# Patient Record
Sex: Female | Born: 1985 | Race: Black or African American | Hispanic: No | Marital: Single | Smoking: Current every day smoker
Health system: Southern US, Community
[De-identification: ages and names within clinical notes are randomized; demographics above are authoritative.]

## PROBLEM LIST (undated history)

## (undated) DIAGNOSIS — R569 Unspecified convulsions: Secondary | ICD-10-CM

## (undated) DIAGNOSIS — M549 Dorsalgia, unspecified: Secondary | ICD-10-CM

---

## 2006-11-06 ENCOUNTER — Emergency Department (HOSPITAL_COMMUNITY): Admission: EM | Admit: 2006-11-06 | Discharge: 2006-11-06 | Payer: Self-pay | Admitting: Emergency Medicine

## 2010-03-31 ENCOUNTER — Emergency Department (HOSPITAL_BASED_OUTPATIENT_CLINIC_OR_DEPARTMENT_OTHER)
Admission: EM | Admit: 2010-03-31 | Discharge: 2010-03-31 | Payer: Self-pay | Source: Home / Self Care | Admitting: Emergency Medicine

## 2010-04-04 LAB — URINALYSIS, ROUTINE W REFLEX MICROSCOPIC
Bilirubin Urine: NEGATIVE
Ketones, ur: NEGATIVE mg/dL
Nitrite: POSITIVE — AB
Protein, ur: NEGATIVE mg/dL
Specific Gravity, Urine: 1.018 (ref 1.005–1.030)
Urine Glucose, Fasting: NEGATIVE mg/dL
Urobilinogen, UA: 0.2 mg/dL (ref 0.0–1.0)
pH: 7.5 (ref 5.0–8.0)

## 2010-04-04 LAB — URINE MICROSCOPIC-ADD ON

## 2010-04-04 LAB — PREGNANCY, URINE: Preg Test, Ur: NEGATIVE

## 2010-12-26 ENCOUNTER — Encounter: Payer: Self-pay | Admitting: *Deleted

## 2010-12-26 ENCOUNTER — Emergency Department (HOSPITAL_BASED_OUTPATIENT_CLINIC_OR_DEPARTMENT_OTHER)
Admission: EM | Admit: 2010-12-26 | Discharge: 2010-12-26 | Disposition: A | Discharge status: Home / Self Care | Payer: Self-pay | Attending: Emergency Medicine | Admitting: Emergency Medicine

## 2010-12-26 DIAGNOSIS — S39012A Strain of muscle, fascia and tendon of lower back, initial encounter: Secondary | ICD-10-CM

## 2010-12-26 DIAGNOSIS — F17200 Nicotine dependence, unspecified, uncomplicated: Secondary | ICD-10-CM | POA: Insufficient documentation

## 2010-12-26 DIAGNOSIS — X58XXXA Exposure to other specified factors, initial encounter: Secondary | ICD-10-CM | POA: Insufficient documentation

## 2010-12-26 DIAGNOSIS — S335XXA Sprain of ligaments of lumbar spine, initial encounter: Secondary | ICD-10-CM | POA: Insufficient documentation

## 2010-12-26 HISTORY — DX: Unspecified convulsions: R56.9

## 2010-12-26 HISTORY — DX: Dorsalgia, unspecified: M54.9

## 2010-12-26 MED ORDER — METHOCARBAMOL 500 MG PO TABS: 500.0000 mg | ORAL_TABLET | Freq: Two times a day (BID) | ORAL | Status: AC

## 2010-12-26 MED ORDER — OXYCODONE-ACETAMINOPHEN 5-325 MG PO TABS
2.0000 | ORAL_TABLET | Freq: Once | ORAL | Status: AC
  Administered 2010-12-26: 2 via ORAL
  Filled 2010-12-26: qty 2

## 2010-12-26 MED ORDER — HYDROCODONE-ACETAMINOPHEN 5-325 MG PO TABS: 2.0000 | ORAL_TABLET | ORAL | Status: AC | PRN
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
## 2010-12-26 NOTE — ED Provider Notes (Signed)
Medical screening examination/treatment/procedure(s) were performed by non-physician practitioner and as supervising physician I was immediately available for consultation/collaboration.   Charles B. Bernette Mayers, MD 12/26/10 [phone]
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
## 2012-10-31 ENCOUNTER — Encounter (HOSPITAL_BASED_OUTPATIENT_CLINIC_OR_DEPARTMENT_OTHER): Payer: Self-pay | Admitting: *Deleted

## 2012-10-31 ENCOUNTER — Emergency Department (HOSPITAL_BASED_OUTPATIENT_CLINIC_OR_DEPARTMENT_OTHER): Payer: BC Managed Care – PPO

## 2012-10-31 ENCOUNTER — Emergency Department (HOSPITAL_BASED_OUTPATIENT_CLINIC_OR_DEPARTMENT_OTHER)
Admission: EM | Admit: 2012-10-31 | Discharge: 2012-10-31 | Disposition: A | Discharge status: Home / Self Care | Payer: BC Managed Care – PPO | Attending: Emergency Medicine | Admitting: Emergency Medicine

## 2012-10-31 DIAGNOSIS — Z79899 Other long term (current) drug therapy: Secondary | ICD-10-CM | POA: Insufficient documentation

## 2012-10-31 DIAGNOSIS — S0990XA Unspecified injury of head, initial encounter: Secondary | ICD-10-CM | POA: Insufficient documentation

## 2012-10-31 DIAGNOSIS — S39012A Strain of muscle, fascia and tendon of lower back, initial encounter: Secondary | ICD-10-CM

## 2012-10-31 DIAGNOSIS — IMO0002 Reserved for concepts with insufficient information to code with codable children: Secondary | ICD-10-CM | POA: Insufficient documentation

## 2012-10-31 DIAGNOSIS — S1091XA Abrasion of unspecified part of neck, initial encounter: Secondary | ICD-10-CM

## 2012-10-31 DIAGNOSIS — Y9389 Activity, other specified: Secondary | ICD-10-CM | POA: Insufficient documentation

## 2012-10-31 DIAGNOSIS — Y92410 Unspecified street and highway as the place of occurrence of the external cause: Secondary | ICD-10-CM | POA: Insufficient documentation

## 2012-10-31 DIAGNOSIS — S46909A Unspecified injury of unspecified muscle, fascia and tendon at shoulder and upper arm level, unspecified arm, initial encounter: Secondary | ICD-10-CM | POA: Insufficient documentation

## 2012-10-31 DIAGNOSIS — F17200 Nicotine dependence, unspecified, uncomplicated: Secondary | ICD-10-CM | POA: Insufficient documentation

## 2012-10-31 DIAGNOSIS — S335XXA Sprain of ligaments of lumbar spine, initial encounter: Secondary | ICD-10-CM | POA: Insufficient documentation

## 2012-10-31 DIAGNOSIS — S4980XA Other specified injuries of shoulder and upper arm, unspecified arm, initial encounter: Secondary | ICD-10-CM | POA: Insufficient documentation

## 2012-10-31 MED ORDER — OXYCODONE-ACETAMINOPHEN 5-325 MG PO TABS: 1.0000 | ORAL_TABLET | Freq: Four times a day (QID) | ORAL | Status: DC | PRN

## 2012-10-31 MED ORDER — OXYCODONE-ACETAMINOPHEN 5-325 MG PO TABS
ORAL_TABLET | ORAL | Status: AC
  Filled 2012-10-31: qty 2

## 2012-10-31 MED ORDER — CYCLOBENZAPRINE HCL 10 MG PO TABS: 10.0000 mg | ORAL_TABLET | Freq: Three times a day (TID) | ORAL | Status: DC | PRN

## 2012-10-31 MED ORDER — OXYCODONE-ACETAMINOPHEN 5-325 MG PO TABS
2.0000 | ORAL_TABLET | Freq: Once | ORAL | Status: AC
  Administered 2012-10-31: 2 via ORAL
  Filled 2012-10-31: qty 2
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
## 2015-02-17 ENCOUNTER — Encounter (HOSPITAL_BASED_OUTPATIENT_CLINIC_OR_DEPARTMENT_OTHER): Payer: Self-pay | Admitting: Emergency Medicine

## 2015-02-17 ENCOUNTER — Emergency Department (HOSPITAL_BASED_OUTPATIENT_CLINIC_OR_DEPARTMENT_OTHER)
Admission: EM | Admit: 2015-02-17 | Discharge: 2015-02-17 | Disposition: A | Discharge status: Home / Self Care | Payer: Self-pay | Attending: Emergency Medicine | Admitting: Emergency Medicine

## 2015-02-17 DIAGNOSIS — Y9289 Other specified places as the place of occurrence of the external cause: Secondary | ICD-10-CM | POA: Insufficient documentation

## 2015-02-17 DIAGNOSIS — Z79899 Other long term (current) drug therapy: Secondary | ICD-10-CM | POA: Insufficient documentation

## 2015-02-17 DIAGNOSIS — Y998 Other external cause status: Secondary | ICD-10-CM | POA: Insufficient documentation

## 2015-02-17 DIAGNOSIS — G8929 Other chronic pain: Secondary | ICD-10-CM | POA: Insufficient documentation

## 2015-02-17 DIAGNOSIS — S3992XA Unspecified injury of lower back, initial encounter: Secondary | ICD-10-CM | POA: Insufficient documentation

## 2015-02-17 DIAGNOSIS — Y9301 Activity, walking, marching and hiking: Secondary | ICD-10-CM | POA: Insufficient documentation

## 2015-02-17 DIAGNOSIS — S8991XA Unspecified injury of right lower leg, initial encounter: Secondary | ICD-10-CM | POA: Insufficient documentation

## 2015-02-17 DIAGNOSIS — W010XXA Fall on same level from slipping, tripping and stumbling without subsequent striking against object, initial encounter: Secondary | ICD-10-CM | POA: Insufficient documentation

## 2015-02-17 DIAGNOSIS — M79661 Pain in right lower leg: Secondary | ICD-10-CM

## 2015-02-17 NOTE — Discharge Instructions (Signed)
Musculoskeletal Pain Musculoskeletal pain is muscle and boney aches and pains. These pains can occur in any part of the body. Your caregiver may treat you without knowing the cause of the pain. They may treat you if blood or urine tests, X-rays, and other tests were normal.  CAUSES There is often not a definite cause or reason for these pains. These pains may be caused by a type of germ (virus). The discomfort may also come from overuse. Overuse includes working out too hard when your body is not fit. Boney aches also come from weather changes. Bone is sensitive to atmospheric pressure changes. HOME CARE INSTRUCTIONS   Ask when your test results will be ready. Make sure you get your test results.  Only take over-the-counter or prescription medicines for pain, discomfort, or fever as directed by your caregiver. If you were given medications for your condition, do not drive, operate machinery or power tools, or sign legal documents for 24 hours. Do not drink alcohol. Do not take sleeping pills or other medications that may interfere with treatment.  Continue all activities unless the activities cause more pain. When the pain lessens, slowly resume normal activities. Gradually increase the intensity and duration of the activities or exercise.  During periods of severe pain, bed rest may be helpful. Lay or sit in any position that is comfortable.  Putting ice on the injured area.  Put ice in a bag.  Place a towel between your skin and the bag.  Leave the ice on for 15 to 20 minutes, 3 to 4 times a day.  Follow up with your caregiver for continued problems and no reason can be found for the pain. If the pain becomes worse or does not go away, it may be necessary to repeat tests or do additional testing. Your caregiver may need to look further for a possible cause. SEEK IMMEDIATE MEDICAL CARE IF:  You have pain that is getting worse and is not relieved by medications.  You develop chest pain  that is associated with shortness or breath, sweating, feeling sick to your stomach (nauseous), or throw up (vomit).  Your pain becomes localized to the abdomen.  You develop any new symptoms that seem different or that concern you. MAKE SURE YOU:   Understand these instructions.  Will watch your condition.  Will get help right away if you are not doing well or get worse.   This information is not intended to replace advice given to you by your health care provider. Make sure you discuss any questions you have with your health care provider.   Document Released: 03/06/2005 Document Revised: 05/29/2011 Document Reviewed: 11/08/2012 Elsevier Interactive Patient Education [employer]2016 Elsevier Inc.  You may return to work immediately with no restrictions.

## 2015-02-17 NOTE — ED Notes (Signed)
Pt fell on November 21, injuring her right leg.  Pt unable to drive or work, until today.  Pt here to get it checked out to be cleared to return to work.

## 2015-02-17 NOTE — ED Provider Notes (Signed)
CSN: [id number]     Arrival date & time 02/17/15  [id number] History   First MD Initiated Contact with Patient 02/17/15 [phone]     Chief Complaint  Patient presents with  . Leg Pain     (Consider location/radiation/quality/duration/timing/severity/associated sxs/prior Treatment) Patient is a 29 y.o. female presenting with leg pain. The history is provided by the patient.  Leg Pain Location:  Leg Time since incident:  9 days Injury: yes   Mechanism of injury: fall   Fall:    Fall occurred:  Tripped and walking   Entrapped after fall: no   Leg location:  R lower leg Pain details:    Quality:  Aching   Radiates to:  Does not radiate   Severity:  Moderate   Onset quality:  Gradual   Timing:  Constant   Progression:  Unchanged Chronicity:  New Prior injury to area:  No Relieved by:  Nothing Worsened by:  Nothing tried Ineffective treatments:  None tried Associated symptoms: back pain (chronic from "bulging discs")   Associated symptoms: no fever, no muscle weakness, no numbness and no swelling     Past Medical History  Diagnosis Date  . Back pain   . Seizure (HCC)    No past surgical history on file. No family history on file. Social History  Substance Use Topics  . Smoking status: Current Every Day Smoker -- 1.00 packs/day  . Smokeless tobacco: None  . Alcohol Use: No   OB History    No data available     Review of Systems  Constitutional: Negative for fever.  Musculoskeletal: Positive for back pain (chronic from "bulging discs").  All other systems reviewed and are negative.     Allergies  Ketorolac tromethamine  Home Medications   Prior to Admission medications   Medication Sig Start Date End Date Taking? Authorizing Provider  cyclobenzaprine (FLEXERIL) 10 MG tablet Take 1 tablet (10 mg total) by mouth 3 (three) times daily as needed for muscle spasms. 10/31/12   Susy Frizzleharles Sheldon, MD  lamoTRIgine (LAMICTAL) 100 MG tablet Take 100 mg by mouth daily.       Historical Provider, MD  oxyCODONE-acetaminophen (PERCOCET/ROXICET) 5-325 MG per tablet Take 1-2 tablets by mouth every 6 (six) hours as needed for pain. 10/31/12   Susy Frizzleharles Sheldon, MD   BP 132/88 mmHg  Pulse 81  Temp(Src) 97.7 F (36.5 C) (Oral)  Ht 5\' 6"  (1.676 m)  Wt 205 lb (92.987 kg)  BMI 33.10 kg/m2  SpO2 100% Physical Exam  Constitutional: She is oriented to person, place, and time. She appears well-developed and well-nourished. No distress.  HENT:  Head: Normocephalic.  Eyes: Conjunctivae are normal.  Neck: Neck supple. No tracheal deviation present.  Cardiovascular: Normal rate and regular rhythm.   Pulmonary/Chest: Effort normal. No respiratory distress.  Abdominal: Soft. She exhibits no distension.  Musculoskeletal:       Right ankle: She exhibits normal range of motion, no swelling, no ecchymosis and no deformity. No tenderness.       Right lower leg: She exhibits no tenderness, no swelling, no edema and no deformity.       Right foot: There is normal range of motion, no tenderness, no bony tenderness and no swelling.  Neurological: She is alert and oriented to person, place, and time.  Skin: Skin is warm and dry.  Psychiatric: She has a normal mood and affect.    ED Course  Procedures (including critical care time) Labs Review Labs Reviewed - No  data to display  Imaging Review No results found. I have personally reviewed and evaluated these images and lab results as part of my medical decision-making.   EKG Interpretation None      MDM   Final diagnoses:  Pain of right lower leg    29 year old female with ongoing mild pain over her right shin after tripping 9 days ago. Employer would not allow her to the back to work until cleared by physician. She has no evidence of acute traumatic injury or any contraindication to normal duty labor. Return to work on unrestricted basis. Pt given instructions for supportive care including NSAIDs, rest, ice, compression,  and elevation to help alleviate symptoms.     Lyndal Pulley, MD 02/17/15 [phone]

## 2016-02-16 ENCOUNTER — Encounter (HOSPITAL_BASED_OUTPATIENT_CLINIC_OR_DEPARTMENT_OTHER): Payer: Self-pay

## 2016-02-16 ENCOUNTER — Emergency Department (HOSPITAL_BASED_OUTPATIENT_CLINIC_OR_DEPARTMENT_OTHER)
Admission: EM | Admit: 2016-02-16 | Discharge: 2016-02-17 | Disposition: A | Discharge status: Home / Self Care | Payer: Self-pay | Attending: Emergency Medicine | Admitting: Emergency Medicine

## 2016-02-16 DIAGNOSIS — F17210 Nicotine dependence, cigarettes, uncomplicated: Secondary | ICD-10-CM | POA: Insufficient documentation

## 2016-02-16 DIAGNOSIS — L02412 Cutaneous abscess of left axilla: Secondary | ICD-10-CM | POA: Insufficient documentation

## 2016-02-16 MED ORDER — LIDOCAINE-EPINEPHRINE (PF) 2 %-1:200000 IJ SOLN: 10.0000 mL | Freq: Once | INTRAMUSCULAR | Status: DC

## 2016-02-16 NOTE — ED Triage Notes (Signed)
C/o "boil" to left arm x 2 weeks-NAD-steady gait

## 2016-02-16 NOTE — ED Provider Notes (Signed)
[facility] Provider Note   CSN: [id number] Arrival date & time: 02/16/16  2209  By signing my name below, I, Rosario AdieWilliam Andrew Hiatt, attest that this documentation has been prepared under the direction and in the presence of Jadarius Commons, PA-C.  Electronically Signed: Rosario AdieWilliam Andrew Hiatt, ED Scribe. 02/16/16. 11:56 PM.  History   Chief Complaint Chief Complaint  Patient presents with  . Abscess   The history is provided by the patient. No language interpreter was used.    HPI Comments: Yvette Mcintyre is a 30 y.o. female who presents to the Emergency Department complaining of a moderate, gradually worsening area of pain and swelling to the left axilla region onset approximately one week ago. She reports associated subjective fever and intermittent chills since the onset of his problem. No h/o similar symptoms. She has been applying aloe vera and warm compresses to the area with minimal relief of her symptoms. Pt states pain is exacerbated with palpation and direct pressure. Denies drainage from the area, nausea, vomiting, or any other associated symptoms.   Past Medical History:  Diagnosis Date  . Back pain   . Seizure (HCC)    There are no active problems to display for this patient.  History reviewed. No pertinent surgical history.  OB History    No data available     Home Medications    Prior to Admission medications   Not on File   Family History No family history on file.  Social History Social History  Substance Use Topics  . Smoking status: Current Every Day Smoker    Packs/day: 1.00    Types: Cigarettes  . Smokeless tobacco: Never Used  . Alcohol use Yes     Comment: occ   Allergies   Ketorolac tromethamine  Review of Systems Review of Systems  Constitutional: Positive for chills and fever (subjective).  Gastrointestinal: Negative for nausea and vomiting.  Musculoskeletal: Positive for myalgias.   Physical Exam Updated Vital  Signs BP (!) 105/51 (BP Location: Left Arm)   Pulse 85   Temp 98.3 F (36.8 C) (Oral)   Resp 18   Ht 5\' 6"  (1.676 m)   Wt 208 lb (94.3 kg)   LMP 02/12/2016   SpO2 95%   BMI 33.57 kg/m   Physical Exam  Constitutional: She appears well-developed and well-nourished. No distress.  HENT:  Head: Normocephalic and atraumatic.  Eyes: Conjunctivae are normal.  Neck: Normal range of motion.  Cardiovascular: Normal rate.   Pulmonary/Chest: Effort normal.  Abdominal: She exhibits no distension.  Musculoskeletal: Normal range of motion.  Neurological: She is alert.  Skin: No pallor.  4 x 4 centimeter abscess to left axilla that is erythematous, tender to the touch, fluctuant. Mild surrounding induration and erythema.  Psychiatric: She has a normal mood and affect. Her behavior is normal.  Nursing note and vitals reviewed.  ED Treatments / Results  DIAGNOSTIC STUDIES: Oxygen Saturation is 95% on RA, adequate by my interpretation.   COORDINATION OF CARE: 11:56 PM-Discussed next steps with pt. Pt verbalized understanding and is agreeable with the plan.   Labs (all labs ordered are listed, but only abnormal results are displayed) Labs Reviewed - No data to display  EKG  EKG Interpretation None      Radiology No results found.  Procedures Procedures   INCISION AND DRAINAGE Performed by: Jaynie CrumbleKIRICHENKO, Renn Dirocco A Consent: Verbal consent obtained. Risks and benefits: risks, benefits and alternatives were discussed Type: abscess  Body area: left axilla  Anesthesia: local infiltration  Incision was made with a scalpel.  Local anesthetic: lidocaine 2% w epinephrine  Anesthetic total: 8 ml  Complexity: complex Blunt dissection to break up loculations  Drainage: purulent  Drainage amount: copious  Packing material: 1/4 in iodoform gauze  Patient tolerance: Patient tolerated the procedure well with no immediate complications.    Medications Ordered in  ED Medications - No data to display  Initial Impression / Assessment and Plan / ED Course  I have reviewed the triage vital signs and the nursing notes.  Pertinent labs & imaging results that were available during my care of the patient were reviewed by me and considered in my medical decision making (see chart for details).  Clinical Course    Patient with left axillary abscess. She is afebrile here, nontoxic-appearing. Abscess incised and drained. There is mild induration around the abscess, will start on Bactrim. Home with Tylenol and Motrin for pain, I will prescribe her 10 tablets of Norco for severe pain, follow-up in 2-3 days as needed.   Vitals:   02/16/16 2214 02/17/16 0026  BP: (!) 105/51 119/76  Pulse: 85 78  Resp: 18 16  Temp: 98.3 F (36.8 C)   TempSrc: Oral   SpO2: 95% 100%  Weight: 94.3 kg   Height: 5\' 6"  (1.676 m)     Final Clinical Impressions(s) / ED Diagnoses   Final diagnoses:  Abscess of left axilla   New Prescriptions New Prescriptions   HYDROCODONE-ACETAMINOPHEN (NORCO) 5-325 MG TABLET    Take 1 tablet by mouth every 6 (six) hours as needed for moderate pain.   SULFAMETHOXAZOLE-TRIMETHOPRIM (BACTRIM DS,SEPTRA DS) 800-160 MG TABLET    Take 1 tablet by mouth 2 (two) times daily.    I personally performed the services described in this documentation, which was scribed in my presence. The recorded information has been reviewed and is accurate.      Jaynie Crumbleatyana Zeniya Lapidus, PA-C 02/17/16 0036    Paula LibraJohn Molpus, MD 02/17/16 [phone]

## 2016-02-17 MED ORDER — SULFAMETHOXAZOLE-TRIMETHOPRIM 800-160 MG PO TABS: 1.0000 | ORAL_TABLET | Freq: Two times a day (BID) | ORAL | 0 refills | Status: AC

## 2016-02-17 MED ORDER — LIDOCAINE-EPINEPHRINE (PF) 2 %-1:200000 IJ SOLN
INTRAMUSCULAR | Status: AC
  Administered 2016-02-17: 20 mL
  Filled 2016-02-17: qty 20

## 2016-02-17 MED ORDER — HYDROCODONE-ACETAMINOPHEN 5-325 MG PO TABS: 1.0000 | ORAL_TABLET | Freq: Four times a day (QID) | ORAL | 0 refills | Status: DC | PRN

## 2016-02-17 NOTE — ED Notes (Signed)
ED Provider at bedside. 

## 2016-02-17 NOTE — Discharge Instructions (Signed)
Warm compresses to the area. In 3 days pull out the packing. If not improving, return to ED. Bactrim as prescribed until all gone. Ibuprofen/tylenol for pain. Norco for severe pain only. Follow up with your doctor.

## 2016-06-11 ENCOUNTER — Encounter (HOSPITAL_BASED_OUTPATIENT_CLINIC_OR_DEPARTMENT_OTHER): Payer: Self-pay | Admitting: Emergency Medicine

## 2016-06-11 ENCOUNTER — Emergency Department (HOSPITAL_BASED_OUTPATIENT_CLINIC_OR_DEPARTMENT_OTHER)
Admission: EM | Admit: 2016-06-11 | Discharge: 2016-06-11 | Disposition: A | Discharge status: Home / Self Care | Payer: Self-pay | Attending: Emergency Medicine | Admitting: Emergency Medicine

## 2016-06-11 ENCOUNTER — Emergency Department (HOSPITAL_BASED_OUTPATIENT_CLINIC_OR_DEPARTMENT_OTHER): Payer: Self-pay

## 2016-06-11 DIAGNOSIS — Z3A01 Less than 8 weeks gestation of pregnancy: Secondary | ICD-10-CM | POA: Insufficient documentation

## 2016-06-11 DIAGNOSIS — R1030 Lower abdominal pain, unspecified: Secondary | ICD-10-CM | POA: Insufficient documentation

## 2016-06-11 DIAGNOSIS — F17210 Nicotine dependence, cigarettes, uncomplicated: Secondary | ICD-10-CM | POA: Insufficient documentation

## 2016-06-11 DIAGNOSIS — N3000 Acute cystitis without hematuria: Secondary | ICD-10-CM

## 2016-06-11 DIAGNOSIS — O99331 Smoking (tobacco) complicating pregnancy, first trimester: Secondary | ICD-10-CM | POA: Insufficient documentation

## 2016-06-11 DIAGNOSIS — Z3491 Encounter for supervision of normal pregnancy, unspecified, first trimester: Secondary | ICD-10-CM

## 2016-06-11 DIAGNOSIS — O2310 Infections of bladder in pregnancy, unspecified trimester: Secondary | ICD-10-CM | POA: Insufficient documentation

## 2016-06-11 LAB — COMPREHENSIVE METABOLIC PANEL
ALT: 27 U/L (ref 14–54)
AST: 22 U/L (ref 15–41)
Albumin: 3.7 g/dL (ref 3.5–5.0)
Alkaline Phosphatase: 48 U/L (ref 38–126)
Anion gap: 8 (ref 5–15)
BUN: 8 mg/dL (ref 6–20)
CO2: 21 mmol/L — ABNORMAL LOW (ref 22–32)
Calcium: 8.8 mg/dL — ABNORMAL LOW (ref 8.9–10.3)
Chloride: 105 mmol/L (ref 101–111)
Creatinine, Ser: 0.67 mg/dL (ref 0.44–1.00)
GFR calc Af Amer: >60 mL/min (ref >60)
GFR calc non Af Amer: >60 mL/min (ref >60)
Glucose, Bld: 85 mg/dL (ref 65–99)
Potassium: 3.7 mmol/L (ref 3.5–5.1)
Sodium: 134 mmol/L — ABNORMAL LOW (ref 135–145)
Total Bilirubin: 0.6 mg/dL (ref 0.3–1.2)
Total Protein: 6.9 g/dL (ref 6.5–8.1)

## 2016-06-11 LAB — URINALYSIS, ROUTINE W REFLEX MICROSCOPIC
Bilirubin Urine: NEGATIVE
Glucose, UA: NEGATIVE mg/dL
Hgb urine dipstick: NEGATIVE
Ketones, ur: NEGATIVE mg/dL
Nitrite: POSITIVE — AB
Protein, ur: NEGATIVE mg/dL
Specific Gravity, Urine: 1.017 (ref 1.005–1.030)
pH: 6.5 (ref 5.0–8.0)

## 2016-06-11 LAB — URINALYSIS, MICROSCOPIC (REFLEX): RBC / HPF: NONE SEEN RBC/hpf (ref 0–5)

## 2016-06-11 LAB — CBC WITH DIFFERENTIAL/PLATELET
Basophils Absolute: 0 10*3/uL (ref 0.0–0.1)
Basophils Relative: 0 %
Eosinophils Absolute: 0.1 10*3/uL (ref 0.0–0.7)
Eosinophils Relative: 1 %
HCT: 35.5 % — ABNORMAL LOW (ref 36.0–46.0)
Hemoglobin: 11.9 g/dL — ABNORMAL LOW (ref 12.0–15.0)
Lymphocytes Relative: 15 %
Lymphs Abs: 2 10*3/uL (ref 0.7–4.0)
MCH: 31.6 pg (ref 26.0–34.0)
MCHC: 33.5 g/dL (ref 30.0–36.0)
MCV: 94.2 fL (ref 78.0–100.0)
Monocytes Absolute: 0.7 10*3/uL (ref 0.1–1.0)
Monocytes Relative: 5 %
Neutro Abs: 10.8 10*3/uL — ABNORMAL HIGH (ref 1.7–7.7)
Neutrophils Relative %: 79 %
Platelets: 295 10*3/uL (ref 150–400)
RBC: 3.77 MIL/uL — ABNORMAL LOW (ref 3.87–5.11)
RDW: 13.9 % (ref 11.5–15.5)
WBC: 13.6 10*3/uL — ABNORMAL HIGH (ref 4.0–10.5)

## 2016-06-11 LAB — LIPASE, BLOOD: Lipase: 17 U/L (ref 11–51)

## 2016-06-11 LAB — HCG, QUANTITATIVE, PREGNANCY: hCG, Beta Chain, Quant, S: 41025 m[IU]/mL — ABNORMAL HIGH (ref <5)

## 2016-06-11 LAB — PREGNANCY, URINE: Preg Test, Ur: POSITIVE — AB

## 2016-06-11 MED ORDER — ACETAMINOPHEN 500 MG PO TABS
1000.0000 mg | ORAL_TABLET | Freq: Once | ORAL | Status: AC
  Administered 2016-06-11: 1000 mg via ORAL
  Filled 2016-06-11: qty 2

## 2016-06-11 MED ORDER — SODIUM CHLORIDE 0.9 % IV BOLUS (SEPSIS)
1000.0000 mL | Freq: Once | INTRAVENOUS | Status: AC
  Administered 2016-06-11: 1000 mL via INTRAVENOUS

## 2016-06-11 MED ORDER — CEPHALEXIN 500 MG PO CAPS: 500.0000 mg | ORAL_CAPSULE | Freq: Four times a day (QID) | ORAL | 0 refills | Status: DC

## 2016-06-11 MED ORDER — HYDROCODONE-ACETAMINOPHEN 5-325 MG PO TABS: 1.0000 | ORAL_TABLET | Freq: Four times a day (QID) | ORAL | 0 refills | Status: DC | PRN

## 2016-06-11 NOTE — ED Notes (Signed)
ED Provider at bedside. 

## 2016-06-11 NOTE — ED Triage Notes (Addendum)
Patient reports right flank pain which began yesterday.  Patient reports she had 1 episode of vomiting after drinking water.  Denies diarrhea. Reports LMP in January.  States possibility of pregnancy.

## 2016-06-11 NOTE — Discharge Instructions (Signed)
Keflex as prescribed.  Hydrocodone as prescribed as needed for pain.  Follow-up with an obstetrician in the next week, and return to the emergency department if your symptoms significantly worsen or change.

## 2016-06-11 NOTE — ED Provider Notes (Signed)
[facility] Provider Note   CSN: [id number] Arrival date & time: 06/11/16  1427   By signing my name below, I, Teofilo Pod, attest that this documentation has been prepared under the direction and in the presence of Geoffery Lyons, MD . Electronically Signed: Teofilo Pod, ED Scribe. 06/11/2016. 3:20 PM.   History   Chief Complaint Chief Complaint  Patient presents with  . Flank Pain    The history is provided by the patient. No language interpreter was used.  Flank Pain  This is a new problem. The current episode started yesterday. The problem occurs constantly. The problem has not changed since onset.Associated symptoms include chest pain. Nothing aggravates the symptoms. Nothing relieves the symptoms. She has tried nothing for the symptoms.   HPI Comments:  Yvette Mcintyre is a 31 y.o. female who presents to the Emergency Department complaining of constant right sided flank pain since yesterday. She states that the pain radiates up to her right chest near the right breast. Pt reports 1 episode of vomiting after drinking water. She notes having a mild dry cough for the past week. LNMP was 2 months ago, and states that she recently had her IUD removed. She states that she is otherwise healthy. No alleviating factors noted. Pt denies any fever, vaginal bleeding, back pain.   Past Medical History:  Diagnosis Date  . Back pain   . Seizure (HCC)     There are no active problems to display for this patient.   History reviewed. No pertinent surgical history.  OB History    No data available       Home Medications    Prior to Admission medications   Medication Sig Start Date End Date Taking? Authorizing Provider  HYDROcodone-acetaminophen (NORCO) 5-325 MG tablet Take 1 tablet by mouth every 6 (six) hours as needed for moderate pain. 02/17/16   Jaynie Crumble, PA-C    Family History History reviewed. No pertinent family history.  Social  History Social History  Substance Use Topics  . Smoking status: Current Every Day Smoker    Packs/day: 1.00    Types: Cigarettes  . Smokeless tobacco: Never Used  . Alcohol use Yes     Comment: occ     Allergies   Ketorolac tromethamine   Review of Systems Review of Systems  Constitutional: Negative for fever.  Respiratory: Positive for cough.   Cardiovascular: Positive for chest pain.  Gastrointestinal: Positive for nausea and vomiting.  Genitourinary: Positive for flank pain. Negative for vaginal bleeding.  Musculoskeletal: Negative for back pain.  All other systems reviewed and are negative.    Physical Exam Updated Vital Signs BP (!) 91/50 (BP Location: Right Arm)   Pulse 71   Temp 98.3 F (36.8 C) (Oral)   Resp 17   Ht 5\' 6"  (1.676 m)   Wt 200 lb (90.7 kg)   LMP 04/16/2016 (Approximate)   SpO2 100%   BMI 32.28 kg/m   Physical Exam  Constitutional: She is oriented to person, place, and time. She appears well-developed and well-nourished. No distress.  HENT:  Head: Normocephalic and atraumatic.  Mouth/Throat: Oropharynx is clear and moist. No oropharyngeal exudate.  Eyes: Conjunctivae and EOM are normal. Pupils are equal, round, and reactive to light.  Neck: Normal range of motion. Neck supple.  No meningismus.  Cardiovascular: Normal rate, regular rhythm, normal heart sounds and intact distal pulses.   No murmur heard. Pulmonary/Chest: Effort normal and breath sounds normal. No respiratory distress.  Abdominal: Soft. There is tenderness. There is no rebound and no guarding.  Mild tenderness to right flank.   Musculoskeletal: Normal range of motion. She exhibits no edema or tenderness.  Neurological: She is alert and oriented to person, place, and time. No cranial nerve deficit. She exhibits normal muscle tone. Coordination normal.  No ataxia on finger to nose bilaterally. No pronator drift. 5/5 strength throughout. CN 2-12 intact.Equal grip strength.  Sensation intact.   Skin: Skin is warm.  Psychiatric: She has a normal mood and affect. Her behavior is normal.  Nursing note and vitals reviewed.    ED Treatments / Results  DIAGNOSTIC STUDIES:  Oxygen Saturation is 100% on RA, normal by my interpretation.    COORDINATION OF CARE:  3:19 PM Will order US of abdomen. Discussed treatment plan with pt at bedside and pt agreed to plan.   Labs (all labs ordered are listed, but only abnormal results are displayed) Labs Reviewed  PREGNANCY, URINE - Abnormal; Notable for the following:       Result Value   Preg Test, Ur POSITIVE (*)    All other components within normal limits  URINALYSIS, ROUTINE W REFLEX MICROSCOPIC    EKG  EKG Interpretation None       Radiology No results found.  Procedures Procedures (including critical care time)  Medications Ordered in ED Medications - No data to display   Initial Impression / Assessment and Plan / ED Course  I have reviewed the triage vital signs and the nursing notes.  Pertinent labs & imaging results that were available during my care of the patient were reviewed by me and considered in my medical decision making (see chart for details).  Patient is a 31 year old female who presents with flank pain since yesterday. She describes an episode of vomiting this afternoon. Laboratory studies and urinalysis were obtained. These were essentially unremarkable with the exception of having a positive pregnancy test. She was then sent for ultrasound which revealed twin gestation with a small subchorionic hemorrhage, but no other concerning findings. She will be discharged, to follow-up with OB/GYN.  Final Clinical Impressions(s) / ED Diagnoses   Final diagnoses:  None    New Prescriptions New Prescriptions   No medications on file  I personally performed the services described in this documentation, which was scribed in my presence. The recorded information has been reviewed and is  accurate.        Geoffery Lyonsouglas Ozell Juhasz, MD 06/14/16 [phone]

## 2016-06-11 NOTE — ED Notes (Signed)
Patient transported to Ultrasound 

## 2017-03-15 ENCOUNTER — Other Ambulatory Visit: Payer: Self-pay

## 2017-03-15 ENCOUNTER — Inpatient Hospital Stay (HOSPITAL_BASED_OUTPATIENT_CLINIC_OR_DEPARTMENT_OTHER)
Admission: EM | Admit: 2017-03-15 | Discharge: 2017-03-18 | DRG: 872 | Disposition: A | Discharge status: Home / Self Care | Payer: Self-pay | Attending: Family Medicine | Admitting: Family Medicine

## 2017-03-15 ENCOUNTER — Encounter (HOSPITAL_BASED_OUTPATIENT_CLINIC_OR_DEPARTMENT_OTHER): Payer: Self-pay

## 2017-03-15 ENCOUNTER — Emergency Department (HOSPITAL_BASED_OUTPATIENT_CLINIC_OR_DEPARTMENT_OTHER): Payer: Self-pay

## 2017-03-15 DIAGNOSIS — A419 Sepsis, unspecified organism: Principal | ICD-10-CM | POA: Diagnosis present

## 2017-03-15 DIAGNOSIS — B9620 Unspecified Escherichia coli [E. coli] as the cause of diseases classified elsewhere: Secondary | ICD-10-CM | POA: Diagnosis present

## 2017-03-15 DIAGNOSIS — E876 Hypokalemia: Secondary | ICD-10-CM | POA: Diagnosis present

## 2017-03-15 DIAGNOSIS — N12 Tubulo-interstitial nephritis, not specified as acute or chronic: Secondary | ICD-10-CM | POA: Diagnosis present

## 2017-03-15 DIAGNOSIS — F17210 Nicotine dependence, cigarettes, uncomplicated: Secondary | ICD-10-CM | POA: Diagnosis present

## 2017-03-15 DIAGNOSIS — N390 Urinary tract infection, site not specified: Secondary | ICD-10-CM

## 2017-03-15 LAB — URINALYSIS, MICROSCOPIC (REFLEX)

## 2017-03-15 LAB — COMPREHENSIVE METABOLIC PANEL
ALBUMIN: 3.6 g/dL (ref 3.5–5.0)
ALT: 16 U/L (ref 14–54)
ANION GAP: 10 (ref 5–15)
AST: 13 U/L — ABNORMAL LOW (ref 15–41)
Alkaline Phosphatase: 47 U/L (ref 38–126)
BUN: 12 mg/dL (ref 6–20)
CALCIUM: 8.3 mg/dL — AB (ref 8.9–10.3)
CHLORIDE: 105 mmol/L (ref 101–111)
CO2: 20 mmol/L — AB (ref 22–32)
Creatinine, Ser: 0.55 mg/dL (ref 0.44–1.00)
GFR calc non Af Amer: >60 mL/min (ref >60)
GLUCOSE: 96 mg/dL (ref 65–99)
POTASSIUM: 3.2 mmol/L — AB (ref 3.5–5.1)
SODIUM: 135 mmol/L (ref 135–145)
Total Bilirubin: 0.5 mg/dL (ref 0.3–1.2)
Total Protein: 7.4 g/dL (ref 6.5–8.1)

## 2017-03-15 LAB — URINALYSIS, ROUTINE W REFLEX MICROSCOPIC
Bilirubin Urine: NEGATIVE
Glucose, UA: NEGATIVE mg/dL
Ketones, ur: 15 mg/dL — AB
Nitrite: POSITIVE — AB
Protein, ur: 100 mg/dL — AB
Specific Gravity, Urine: 1.025 (ref 1.005–1.030)
pH: 6 (ref 5.0–8.0)

## 2017-03-15 LAB — CBC
HEMATOCRIT: 33.8 % — AB (ref 36.0–46.0)
HEMOGLOBIN: 11.1 g/dL — AB (ref 12.0–15.0)
MCH: 31.5 pg (ref 26.0–34.0)
MCHC: 32.8 g/dL (ref 30.0–36.0)
MCV: 96 fL (ref 78.0–100.0)
Platelets: 294 10*3/uL (ref 150–400)
RBC: 3.52 MIL/uL — AB (ref 3.87–5.11)
RDW: 13.4 % (ref 11.5–15.5)
WBC: 21 10*3/uL — ABNORMAL HIGH (ref 4.0–10.5)

## 2017-03-15 LAB — DIFFERENTIAL
Basophils Absolute: 0 10*3/uL (ref 0.0–0.1)
Basophils Relative: 0 %
EOS PCT: 0 %
Eosinophils Absolute: 0 10*3/uL (ref 0.0–0.7)
LYMPHS ABS: 0.9 10*3/uL (ref 0.7–4.0)
LYMPHS PCT: 5 %
MONOS PCT: 5 %
Monocytes Absolute: 1.1 10*3/uL — ABNORMAL HIGH (ref 0.1–1.0)
NEUTROS PCT: 90 %
Neutro Abs: 18.9 10*3/uL — ABNORMAL HIGH (ref 1.7–7.7)

## 2017-03-15 LAB — PREGNANCY, URINE: Preg Test, Ur: NEGATIVE

## 2017-03-15 MED ORDER — ONDANSETRON HCL 4 MG/2ML IJ SOLN
INTRAMUSCULAR | Status: AC
  Administered 2017-03-15: 4 mg
  Filled 2017-03-15: qty 2

## 2017-03-15 MED ORDER — ONDANSETRON HCL 4 MG/2ML IJ SOLN
4.0000 mg | Freq: Once | INTRAMUSCULAR | Status: AC
  Administered 2017-03-15: 4 mg via INTRAVENOUS
  Filled 2017-03-15: qty 2

## 2017-03-15 MED ORDER — CEPHALEXIN 250 MG PO CAPS
500.0000 mg | ORAL_CAPSULE | Freq: Once | ORAL | Status: AC
  Administered 2017-03-15: 500 mg via ORAL
  Filled 2017-03-15: qty 2

## 2017-03-15 MED ORDER — SODIUM CHLORIDE 0.9 % IV BOLUS (SEPSIS)
1000.0000 mL | Freq: Once | INTRAVENOUS | Status: AC
  Administered 2017-03-15: 1000 mL via INTRAVENOUS

## 2017-03-15 MED ORDER — ACETAMINOPHEN 325 MG PO TABS
650.0000 mg | ORAL_TABLET | Freq: Once | ORAL | Status: AC
  Administered 2017-03-15: 650 mg via ORAL
  Filled 2017-03-15: qty 2

## 2017-03-15 MED ORDER — FENTANYL CITRATE (PF) 100 MCG/2ML IJ SOLN
50.0000 ug | Freq: Once | INTRAMUSCULAR | Status: AC
  Administered 2017-03-15: 50 ug via INTRAVENOUS
  Filled 2017-03-15: qty 2

## 2017-03-15 MED ORDER — CEFTRIAXONE SODIUM 1 G IJ SOLR
1.0000 g | Freq: Once | INTRAMUSCULAR | Status: AC
  Administered 2017-03-15: 1 g via INTRAVENOUS
  Filled 2017-03-15: qty 10

## 2017-03-15 NOTE — ED Notes (Signed)
Pt states she is not staying, wants to go home; EDP notified

## 2017-03-15 NOTE — ED Notes (Signed)
Pt vomiting after PO challenge, EDP notified

## 2017-03-15 NOTE — ED Provider Notes (Signed)
[facility] EMERGENCY DEPARTMENT Provider Note   CSN: [id number] Arrival date & time: 03/15/17  1428     History   Chief Complaint Chief Complaint  Patient presents with  . Generalized Body Aches  . Abdominal Pain    HPI Yvette Mcintyre is a 31 y.o. female.  HPI   Patient is a 31 year old female presenting with fever and body aches.  Patient reports she has had burning with urination.  She reports often when she is on birth control she has these symptoms.  She says she "sticks with a urinary infection" commonly.  Patient reports today at work she was unable to make a phone calls bc she was feeling so poorly.  Patient reports she been having trouble keeping things down, has had mild nausea.  Her right flank has been hurting.  This is been constant, not colicky in nature.  No vaginal discharge.  Past Medical History:  Diagnosis Date  . Back pain   . Seizure (HCC)     There are no active problems to display for this patient.   History reviewed. No pertinent surgical history.  OB History    No data available       Home Medications    Prior to Admission medications   Medication Sig Start Date End Date Taking? Authorizing Provider  cephALEXin (KEFLEX) 500 MG capsule Take 1 capsule (500 mg total) by mouth 4 (four) times daily. 06/11/16   Geoffery Lyonselo, Douglas, MD  HYDROcodone-acetaminophen (NORCO) 5-325 MG tablet Take 1-2 tablets by mouth every 6 (six) hours as needed. 06/11/16   Geoffery Lyonselo, Douglas, MD    Family History History reviewed. No pertinent family history.  Social History Social History   Tobacco Use  . Smoking status: Current Every Day Smoker    Packs/day: 1.00    Types: Cigarettes  . Smokeless tobacco: Never Used  Substance Use Topics  . Alcohol use: Yes    Comment: occ  . Drug use: No     Allergies   Ketorolac tromethamine   Review of Systems Review of Systems  Constitutional: Positive for fatigue and fever. Negative for activity change.    HENT: Negative for congestion.   Respiratory: Negative for shortness of breath.   Cardiovascular: Negative for chest pain.  Gastrointestinal: Negative for abdominal pain.  Genitourinary: Positive for dysuria and flank pain. Negative for dyspareunia, enuresis, vaginal discharge and vaginal pain.  All other systems reviewed and are negative.    Physical Exam Updated Vital Signs BP (!) 117/58 (BP Location: Right Arm)   Pulse (!) 117   Temp 99.7 F (37.6 C) (Oral)   Resp 20   Ht 5\' 5"  (1.651 m)   Wt 86.2 kg (190 lb)   LMP 02/15/2017   SpO2 100%   BMI 31.62 kg/m   Physical Exam  Constitutional: She is oriented to person, place, and time. She appears well-developed and well-nourished.  Febrile female  HENT:  Head: Normocephalic and atraumatic.  Eyes: Right eye exhibits no discharge.  Cardiovascular: Regular rhythm and normal heart sounds.  No murmur heard. Tachycardia.  Pulmonary/Chest: Effort normal and breath sounds normal. She has no wheezes. She has no rales.  Abdominal: Soft. Normal appearance. She exhibits no distension. There is no tenderness.  Genitourinary: Deviated:    Neurological: She is oriented to person, place, and time.  Skin: Skin is warm and dry. She is not diaphoretic.  Psychiatric: She has a normal mood and affect.  Nursing note and vitals reviewed.  ED Treatments / Results  Labs (all labs ordered are listed, but only abnormal results are displayed) Labs Reviewed  URINALYSIS, ROUTINE W REFLEX MICROSCOPIC - Abnormal; Notable for the following components:      Result Value   APPearance CLOUDY (*)    Hgb urine dipstick LARGE (*)    Ketones, ur 15 (*)    Protein, ur 100 (*)    Nitrite POSITIVE (*)    Leukocytes, UA SMALL (*)    All other components within normal limits  URINALYSIS, MICROSCOPIC (REFLEX) - Abnormal; Notable for the following components:   Bacteria, UA MANY (*)    Squamous Epithelial / LPF 0-5 (*)    All other components within  normal limits  URINE CULTURE  PREGNANCY, URINE  COMPREHENSIVE METABOLIC PANEL  CBC WITH DIFFERENTIAL/PLATELET    EKG  EKG Interpretation None       Radiology No results found.  Procedures Procedures (including critical care time)  Medications Ordered in ED Medications  sodium chloride 0.9 % bolus 1,000 mL (not administered)  ondansetron (ZOFRAN) injection 4 mg (not administered)  acetaminophen (TYLENOL) tablet 650 mg (not administered)     Initial Impression / Assessment and Plan / ED Course  I have reviewed the triage vital signs and the nursing notes.  Pertinent labs & imaging results that were available during my care of the patient were reviewed by me and considered in my medical decision making (see chart for details).     Patient is a 31 year old female presenting with fever and body aches.  Patient reports she has had burning with urination.  She reports often when she is on birth control she has these symptoms.  She says she "sticks with a urinary infection" commonly.  Patient reports today at work she was unable to make a phone calls bc she was feeling so poorly.  Patient reports she been having trouble keeping things down, has had mild nausea.  Her right flank has been hurting.  This is been constant, not colicky in nature.  No vaginal discharge.  4:35 PM Patient here with appears to be pyelonephritis.  Will treat with IV fluids, nausea medication.  Start patient on antibiotics.  I think patient will likely be able to go home when she is feeling better.  9:06 PM Patient has been unable to tolerate antibiotics.  We have tried several times with p.o. challenges.  However she continues to vomit.  However patient refuses admission.  We will give her a dose of IV Rocephin hoping that this will help until she is able to take medications at home.  We talked to her about how she may get sicker, she may have disability or death.  But patient wants to go home.   10:55  PM  Called patient's mom in order to come see her.  Because I really felt that she was too ill to go home.  She is febrile to 102.  She is unable to take her antibiotics.  Has been vomiting.  With a heart rate in the 120s.  Patient's mom and pastor are now at bedside and they have convinced her to stay.  Will admit her to the hospital.   Final Clinical Impressions(s) / ED Diagnoses   Final diagnoses:  None    ED Discharge Orders    None       Abelino DerrickMackuen, Feliciano Wynter Lyn, MD 03/15/17 2256

## 2017-03-15 NOTE — ED Notes (Signed)
Pt notified if she refuses to be admitted to the hospital then she will need to sign out AMA. Pt educated on AMA. Pt states she will make some phone calls and let me know

## 2017-03-15 NOTE — ED Notes (Signed)
ED Provider at bedside. 

## 2017-03-15 NOTE — Progress Notes (Signed)
[facility].  She is a 30yo with h/o UTIs presenting with pyelonephritis.  She has had UTI symptoms, +UA, leukocytosis, +CT.  Given PO doses but vomited antibiotics.  Patient continued to refuse admission.  Dr. Corlis LeakMacKuen finally called her mother and preacher to come in and convince her to stay for admission.  [facility].  Georgana CurioJennifer E. Camber Ninh, M.D.

## 2017-03-15 NOTE — ED Triage Notes (Signed)
Pt reports generalized body aches, abd pain, and n/v. Pt denies diarrhea. Pt concerned she may be reacting to her birth control. Pt denies urinary s/s. Pt A+OX4, NAD.

## 2017-03-16 ENCOUNTER — Other Ambulatory Visit: Payer: Self-pay

## 2017-03-16 ENCOUNTER — Encounter (HOSPITAL_COMMUNITY): Payer: Self-pay

## 2017-03-16 DIAGNOSIS — N12 Tubulo-interstitial nephritis, not specified as acute or chronic: Secondary | ICD-10-CM

## 2017-03-16 DIAGNOSIS — A419 Sepsis, unspecified organism: Principal | ICD-10-CM

## 2017-03-16 DIAGNOSIS — N390 Urinary tract infection, site not specified: Secondary | ICD-10-CM | POA: Diagnosis present

## 2017-03-16 LAB — BASIC METABOLIC PANEL
Anion gap: 8 (ref 5–15)
BUN: 8 mg/dL (ref 6–20)
CALCIUM: 8.1 mg/dL — AB (ref 8.9–10.3)
CHLORIDE: 107 mmol/L (ref 101–111)
CO2: 23 mmol/L (ref 22–32)
CREATININE: 0.76 mg/dL (ref 0.44–1.00)
Glucose, Bld: 123 mg/dL — ABNORMAL HIGH (ref 65–99)
Potassium: 3.4 mmol/L — ABNORMAL LOW (ref 3.5–5.1)
SODIUM: 138 mmol/L (ref 135–145)

## 2017-03-16 LAB — CBC
HCT: 33.1 % — ABNORMAL LOW (ref 36.0–46.0)
HEMOGLOBIN: 10.9 g/dL — AB (ref 12.0–15.0)
MCH: 32.1 pg (ref 26.0–34.0)
MCHC: 32.9 g/dL (ref 30.0–36.0)
MCV: 97.4 fL (ref 78.0–100.0)
PLATELETS: 291 10*3/uL (ref 150–400)
RBC: 3.4 MIL/uL — ABNORMAL LOW (ref 3.87–5.11)
RDW: 13.8 % (ref 11.5–15.5)
WBC: 19.5 10*3/uL — ABNORMAL HIGH (ref 4.0–10.5)

## 2017-03-16 LAB — HIV ANTIBODY (ROUTINE TESTING W REFLEX): HIV SCREEN 4TH GENERATION: NONREACTIVE

## 2017-03-16 MED ORDER — IBUPROFEN 200 MG PO TABS
600.0000 mg | ORAL_TABLET | Freq: Four times a day (QID) | ORAL | Status: DC | PRN
  Administered 2017-03-16 – 2017-03-18 (×3): 600 mg via ORAL
  Filled 2017-03-16 (×4): qty 3

## 2017-03-16 MED ORDER — ZOLPIDEM TARTRATE 5 MG PO TABS
5.0000 mg | ORAL_TABLET | Freq: Once | ORAL | Status: DC
  Filled 2017-03-16: qty 1

## 2017-03-16 MED ORDER — MORPHINE SULFATE (PF) 4 MG/ML IV SOLN
2.0000 mg | INTRAVENOUS | Status: DC | PRN
  Administered 2017-03-16: 3 mg via INTRAVENOUS
  Administered 2017-03-16 (×3): 2 mg via INTRAVENOUS
  Administered 2017-03-17: 4 mg via INTRAVENOUS
  Filled 2017-03-16 (×5): qty 1

## 2017-03-16 MED ORDER — SODIUM CHLORIDE 0.9 % IV SOLN
INTRAVENOUS | Status: DC
  Administered 2017-03-16 – 2017-03-17 (×4): via INTRAVENOUS
  Administered 2017-03-17: 75 mL/h via INTRAVENOUS
  Administered 2017-03-17 (×2): via INTRAVENOUS

## 2017-03-16 MED ORDER — PROMETHAZINE HCL 25 MG/ML IJ SOLN
12.5000 mg | Freq: Once | INTRAMUSCULAR | Status: AC
  Administered 2017-03-16: 12.5 mg via INTRAVENOUS
  Filled 2017-03-16: qty 1

## 2017-03-16 MED ORDER — ACETAMINOPHEN 650 MG RE SUPP: 650.0000 mg | Freq: Four times a day (QID) | RECTAL | Status: DC | PRN

## 2017-03-16 MED ORDER — NICOTINE 21 MG/24HR TD PT24
21.0000 mg | MEDICATED_PATCH | Freq: Every day | TRANSDERMAL | Status: DC
  Administered 2017-03-16 – 2017-03-18 (×3): 21 mg via TRANSDERMAL
  Filled 2017-03-16 (×3): qty 1

## 2017-03-16 MED ORDER — DEXTROSE 5 % IV SOLN
1.0000 g | INTRAVENOUS | Status: DC
  Administered 2017-03-16 – 2017-03-17 (×2): 1 g via INTRAVENOUS
  Filled 2017-03-16 (×3): qty 10

## 2017-03-16 MED ORDER — ONDANSETRON HCL 4 MG PO TABS: 4.0000 mg | ORAL_TABLET | Freq: Four times a day (QID) | ORAL | Status: DC | PRN

## 2017-03-16 MED ORDER — MORPHINE SULFATE (PF) 4 MG/ML IV SOLN
2.0000 mg | Freq: Once | INTRAVENOUS | Status: AC
  Administered 2017-03-16: 2 mg via INTRAVENOUS
  Filled 2017-03-16: qty 1

## 2017-03-16 MED ORDER — ONDANSETRON HCL 4 MG/2ML IJ SOLN
4.0000 mg | Freq: Four times a day (QID) | INTRAMUSCULAR | Status: DC | PRN
  Administered 2017-03-16: 4 mg via INTRAVENOUS
  Filled 2017-03-16: qty 2

## 2017-03-16 MED ORDER — ACETAMINOPHEN 325 MG PO TABS
650.0000 mg | ORAL_TABLET | Freq: Four times a day (QID) | ORAL | Status: DC | PRN
  Administered 2017-03-16 – 2017-03-17 (×4): 650 mg via ORAL
  Filled 2017-03-16 (×4): qty 2

## 2017-03-16 MED ORDER — ENOXAPARIN SODIUM 40 MG/0.4ML ~~LOC~~ SOLN
40.0000 mg | SUBCUTANEOUS | Status: DC
  Administered 2017-03-16 – 2017-03-18 (×3): 40 mg via SUBCUTANEOUS
  Filled 2017-03-16 (×3): qty 0.4

## 2017-03-16 NOTE — Plan of Care (Signed)
  Education: Knowledge of General Education information will improve 03/16/2017 0602 - Progressing by Herbert PunAddison, Diandre Merica Y, RN

## 2017-03-16 NOTE — Progress Notes (Signed)
Subjective: Patient admitted this morning, see detailed H&P by Dr Julian ReilGardner 31 y.o. female with medical history significant of seizure in past, not on any chronic seizure meds.  [facility] with c/o fever, body aches, abd pain, flank pain, dysuria.  She suspects she has a UTI.  Having N/V.  Symptoms constant, nothing makes better or worse.  Presents to ED.   Patient complains of abdominal pain.  Vitals:   03/16/17 0657 03/16/17 1700  BP: (!) 102/57 129/80  Pulse:  89  Resp:  20  Temp:  98.7 F (37.1 C)  SpO2:  100%      A/P  Right side pyelonephritis  Continue Rocephin. Morphine prn Zofran prn    Meredeth IdeGagan S Vickee Mormino [facility] [phone]- [phone]

## 2017-03-16 NOTE — Plan of Care (Signed)
  Education: Knowledge of General Education information will improve 03/16/2017 2123 - Progressing by Herbert PunAddison, Maylani Embree Y, RN   Health Behavior/Discharge Planning: Ability to manage health-related needs will improve 03/16/2017 2123 - Progressing by Yaris Ferrell, Dwana Melenahloe Y, RN

## 2017-03-16 NOTE — Progress Notes (Signed)
Nutrition Brief Note  Patient identified on the Malnutrition Screening Tool (MST) Report  Wt Readings from Last 15 Encounters:  03/16/17 198 lb 6.6 oz (90 kg)  06/11/16 200 lb (90.7 kg)  02/16/16 208 lb (94.3 kg)  02/17/15 205 lb (93 kg)  10/31/12 200 lb (90.7 kg)  12/26/10 190 lb (86.2 kg)    Body mass index is 32.02 kg/m. Patient meets criteria for obesity based on current BMI. Per review of Care Everywhere, pt weighed 210 lbs on 9/24 which indicates 12 lb weight loss (5.7% body weight) in the past 3 months. This is not significant for time frame.   She has hx of seizures and is on medication for this. [facility] with fever, body aches, abdominal and flank pain, and dysuria. She was also experiencing N/V for 1-2 days PTA. She initially wanted to leave AMA but mother and pastor convinced her to stay; she is currently an OBS patient. Diet advanced from NPO to CLD today at 2:30 AM and no intakes since that time. No documented episodes of emesis since [facility].  Medications reviewed; PRN oral and IV Zofran.  Labs reviewed; K: 3.4 mmol/L, Ca: 8.1 mg/dL. Suspect hypokalemia is 2/2 vomiting.  IVF: NS @ 125 mL/hr.   No nutrition interventions warranted at this time. If nutrition issues arise, please consult RD.     Trenton GammonJessica Theodoros Stjames, MS, RD, LDN, [facility] Inpatient Clinical Dietitian Pager # [phone] After hours/weekend pager # [phone]

## 2017-03-16 NOTE — H&P (Signed)
History and Physical    Yvette Mcintyre ZOX:[id number] DOB: [birth year] DOA: 03/15/2017  PCP: Patient, No Pcp Per  [facility]  I have personally briefly reviewed patient[facility]  Chief Complaint: Flank pain  HPI: Yvette NorrisDeosha Mauss is a 31 y.o. female with medical history significant of seizure in past, not on any chronic seizure meds.  [facility] with c/o fever, body aches, abd pain, flank pain, dysuria.  She suspects she has a UTI.  Having N/V.  Symptoms constant, nothing makes better or worse.  Presents to ED.   ED Course: UTI, WBC 21k, and CT demonstrates findings c/w R sided pyelonephritis.   Review of Systems: As per HPI otherwise 10 point review of systems negative.   Past Medical History:  Diagnosis Date  . Back pain   . Seizure [facility](HCC)     History reviewed. No pertinent surgical history.   reports that she has been smoking cigarettes.  She has been smoking about 1.00 pack per day. she has never used smokeless tobacco. She reports that she drinks alcohol. She reports that she does not use drugs.  Allergies  Allergen Reactions  . Ketorolac Tromethamine     seizure    History reviewed. No pertinent family history.   Prior to Admission medications   Medication Sig Start Date End Date Taking? Authorizing Provider  acetaminophen (TYLENOL) 500 MG tablet Take 1,000 mg by mouth every 6 (six) hours as needed for mild pain.   Yes [provider]  PRESCRIPTION MEDICATION Take 1 tablet by mouth daily.   Yes [provider]    Physical Exam: Vitals:   03/15/17 2129 03/15/17 2229 03/16/17 0051 03/16/17 0220  BP: (!) 93/50 103/62 (!) 100/55 (!) 100/51  Pulse: 97 (!) 101 87 80  Resp: 20 20 16 18   Temp: (!) 102.9 F (39.4 C) (!) 101.3 F (38.5 C) (!) 100.7 F (38.2 C) 99.8 F (37.7 C)  TempSrc: Oral Oral Oral Oral  SpO2: 99% 100% 100% 100%  Weight:      Height:        Constitutional:  NAD, calm, comfortable Eyes: PERRL, lids and conjunctivae normal ENMT: Mucous membranes are moist. Posterior pharynx clear of any exudate or lesions.Normal dentition.  Neck: normal, supple, no masses, no thyromegaly Respiratory: clear to auscultation bilaterally, no wheezing, no crackles. Normal respiratory effort. No accessory muscle use.  Cardiovascular: Regular rate and rhythm, no murmurs / rubs / gallops. No extremity edema. 2+ pedal pulses. No carotid bruits.  Abdomen: no tenderness, no masses palpated. No hepatosplenomegaly. Bowel sounds positive.  Musculoskeletal: no clubbing / cyanosis. No joint deformity upper and lower extremities. Good ROM, no contractures. Normal muscle tone.  Skin: no rashes, lesions, ulcers. No induration Neurologic: CN 2-12 grossly intact. Sensation intact, DTR normal. Strength 5/5 in all 4.  Psychiatric: Normal judgment and insight. Alert and oriented x 3. Normal mood.    Labs on Admission: I have personally reviewed following labs and imaging studies  CBC: Recent Labs  Lab 03/15/17 1801  WBC 21.0*  NEUTROABS 18.9*  HGB 11.1*  HCT 33.8*  MCV 96.0  PLT 294   Basic Metabolic Panel: Recent Labs  Lab 03/15/17 1646  NA 135  K 3.2*  CL 105  CO2 20*  GLUCOSE 96  BUN 12  CREATININE 0.55  CALCIUM 8.3*   GFR: Estimated Creatinine Clearance: 111.5 mL/min (by C-G formula based on SCr of 0.55 mg/dL). Liver  Function Tests: Recent Labs  Lab 03/15/17 1646  AST 13*  ALT 16  ALKPHOS 47  BILITOT 0.5  PROT 7.4  ALBUMIN 3.6   No results for input(s): LIPASE, AMYLASE in the last 168 hours. No results for input(s): AMMONIA in the last 168 hours. Coagulation Profile: No results for input(s): INR, PROTIME in the last 168 hours. Cardiac Enzymes: No results for input(s): CKTOTAL, CKMB, CKMBINDEX, TROPONINI in the last 168 hours. BNP (last 3 results) No results for input(s): PROBNP in the last 8760 hours. HbA1C: No results for input(s): HGBA1C in the  last 72 hours. CBG: No results for input(s): GLUCAP in the last 168 hours. Lipid Profile: No results for input(s): CHOL, HDL, LDLCALC, TRIG, CHOLHDL, LDLDIRECT in the last 72 hours. Thyroid Function Tests: No results for input(s): TSH, T4TOTAL, FREET4, T3FREE, THYROIDAB in the last 72 hours. Anemia Panel: No results for input(s): VITAMINB12, FOLATE, FERRITIN, TIBC, IRON, RETICCTPCT in the last 72 hours. Urine analysis:    Component Value Date/Time   COLORURINE YELLOW 03/15/2017 1459   APPEARANCEUR CLOUDY (A) 03/15/2017 1459   LABSPEC 1.025 03/15/2017 1459   PHURINE 6.0 03/15/2017 1459   GLUCOSEU NEGATIVE 03/15/2017 1459   HGBUR LARGE (A) 03/15/2017 1459   BILIRUBINUR NEGATIVE 03/15/2017 1459   KETONESUR 15 (A) 03/15/2017 1459   PROTEINUR 100 (A) 03/15/2017 1459   UROBILINOGEN 0.2 03/31/2010 1410   NITRITE POSITIVE (A) 03/15/2017 1459   LEUKOCYTESUR SMALL (A) 03/15/2017 1459    Radiological Exams on Admission: Ct Renal Stone Study  Result Date: 03/15/2017 CLINICAL DATA:  31 y/o  F; right flank pain and hematuria. EXAM: CT ABDOMEN AND PELVIS WITHOUT CONTRAST TECHNIQUE: Multidetector CT imaging of the abdomen and pelvis was performed following the standard protocol without IV contrast. COMPARISON:  07/20/2015 CT abdomen and pelvis. FINDINGS: Lower chest: No acute abnormality. Hepatobiliary: No focal liver abnormality is seen. No gallstones, gallbladder wall thickening, or biliary dilatation. Pancreas: Unremarkable. No pancreatic ductal dilatation or surrounding inflammatory changes. Spleen: Normal in size without focal abnormality. Adrenals/Urinary Tract: Normal adrenal glands. Normal left kidney and ureter. Extensive right perinephric stranding. No right hydronephrosis. Decompressed bladder. Stomach/Bowel: Stomach is within normal limits. Appendix appears normal. No evidence of bowel wall thickening, distention, or inflammatory changes. Vascular/Lymphatic: No significant vascular findings  are present. No enlarged abdominal or pelvic lymph nodes. Reproductive: Uterus and bilateral adnexa are unremarkable. Other: No abdominal wall hernia or abnormality. No abdominopelvic ascites. Musculoskeletal: No fracture is seen. L5-S1 moderate loss of intervertebral disc space height. IMPRESSION: Extensive right perinephric stranding. No hydronephrosis or urinary stone disease identified. Findings may represent infection of the renal collecting system/pyelonephritis or a recently passed stone. Electronically Signed   By: Mitzi Hansen M.D.   On: 03/15/2017 22:28    EKG: Independently reviewed.  Assessment/Plan Principal Problem:   Pyelonephritis Active Problems:   Sepsis secondary to UTI (HCC)    1. R sided pyelonephritis, sepsis due to UTI - 1. Rocephin 2. UCx pending 3. BCx dont appear to have been drawn in ED, will order these here for T > 100.4 4. IVF: 1L bolus and 125 cc/hr 5. Tylenol PRN fever 6. Ibuprofen PRN headache 7. Morphine PRN pain 8. Zofran PRN nausea 9. Repeat CBC and BMP in AM  DVT prophylaxis: Lovenox Code Status: Full Family Communication: No family in room Disposition Plan: Home after admit Consults called: None Admission status: Place in obs   Hillary Bow. DO [facility] Pager [phone]  If 7AM-7PM, please contact day  team taking care of patient [email or web address] Password [email or web address]  03/16/2017, 3:45 AM

## 2017-03-17 DIAGNOSIS — E876 Hypokalemia: Secondary | ICD-10-CM

## 2017-03-17 LAB — BASIC METABOLIC PANEL
ANION GAP: 6 (ref 5–15)
CHLORIDE: 109 mmol/L (ref 101–111)
CO2: 22 mmol/L (ref 22–32)
Calcium: 7.8 mg/dL — ABNORMAL LOW (ref 8.9–10.3)
Creatinine, Ser: 0.66 mg/dL (ref 0.44–1.00)
Glucose, Bld: 85 mg/dL (ref 65–99)
POTASSIUM: 3.3 mmol/L — AB (ref 3.5–5.1)
SODIUM: 137 mmol/L (ref 135–145)

## 2017-03-17 LAB — CBC
HCT: 30.5 % — ABNORMAL LOW (ref 36.0–46.0)
HEMOGLOBIN: 10.4 g/dL — AB (ref 12.0–15.0)
MCH: 32.7 pg (ref 26.0–34.0)
MCHC: 34.1 g/dL (ref 30.0–36.0)
MCV: 95.9 fL (ref 78.0–100.0)
PLATELETS: 283 10*3/uL (ref 150–400)
RBC: 3.18 MIL/uL — AB (ref 3.87–5.11)
RDW: 13.5 % (ref 11.5–15.5)
WBC: 12.6 10*3/uL — AB (ref 4.0–10.5)

## 2017-03-17 MED ORDER — OXYCODONE-ACETAMINOPHEN 7.5-325 MG PO TABS
2.0000 | ORAL_TABLET | Freq: Three times a day (TID) | ORAL | Status: DC | PRN
  Administered 2017-03-17 – 2017-03-18 (×3): 2 via ORAL
  Filled 2017-03-17 (×4): qty 2

## 2017-03-17 MED ORDER — POTASSIUM CHLORIDE CRYS ER 20 MEQ PO TBCR
40.0000 meq | EXTENDED_RELEASE_TABLET | Freq: Once | ORAL | Status: AC
  Administered 2017-03-17: 40 meq via ORAL
  Filled 2017-03-17: qty 2

## 2017-03-17 MED ORDER — PROMETHAZINE HCL 25 MG/ML IJ SOLN
12.5000 mg | Freq: Once | INTRAMUSCULAR | Status: AC
  Administered 2017-03-17: 12.5 mg via INTRAVENOUS
  Filled 2017-03-17: qty 1

## 2017-03-17 NOTE — Progress Notes (Signed)
[facility]  PROGRESS NOTE  Yvette Mcintyre ZOX:[id number] DOB: 04/04/1985 DOA: 03/15/2017 PCP: Patient, No Pcp Per   Brief HPI:    31 y.o.femalewith medical history significant ofseizure in past, not on any chronic seizure meds. Patient [facility] with c/o fever, body aches, abd pain, flank pain, dysuria. She suspects she has a UTI. Having N/V. Symptoms constant, nothing makes better or worse. Presents to ED.      Subjective   Patient seen and examined, continues to have right flank pain. Urine culture growing equally. Final sensitivity is pending. Patient is afebrile   Assessment/Plan:     1. Right pyelonephritis- urine culture going E coli,  Will follow final sensitivity results. Will change pain med from IV morphine to Percocet 1 to 2 tablets Q8 hours PRN. 2. Hypokalemia-potassium is 3.3. Replace potassium and check BMP in am.    DVT prophylaxis: Lovenox  Code Status:  Full code  Family Communication: discussed with  patient's mother at bedside  Disposition Plan: home in 1-2 days   Consultants:  none  Procedures:  none  Continuous infusions . sodium chloride 125 mL/hr at 03/17/17 0901  . cefTRIAXone (ROCEPHIN)  IV Stopped (03/16/17 2120)      Antibiotics:   Anti-infectives (From admission, onward)   Start     Dose/Rate Route Frequency Ordered Stop   03/16/17 2100  cefTRIAXone (ROCEPHIN) 1 g in dextrose 5 % 50 mL IVPB     1 g 100 mL/hr over 30 Minutes Intravenous Every 24 hours 03/16/17 0230     03/15/17 2100  cefTRIAXone (ROCEPHIN) 1 g in dextrose 5 % 50 mL IVPB     1 g 100 mL/hr over 30 Minutes Intravenous  Once 03/15/17 2058 03/15/17 2139   03/15/17 1815  cephALEXin (KEFLEX) capsule 500 mg     500 mg Oral  Once 03/15/17 1809 03/15/17 1819       Objective   Vitals:   03/16/17 1700 03/16/17 2030 03/16/17 2318 03/17/17 0613  BP: 129/80 126/80 115/65 119/70  Pulse: 89 83 70 65  Resp: 20 19 (!) 22 (!) 21  Temp:  98.7 F (37.1 C) 99.6 F (37.6 C) 99.8 F (37.7 C) 99.4 F (37.4 C)  TempSrc: Oral Oral Oral Oral  SpO2: 100% 100% 100% 99%  Weight:      Height:        Intake/Output Summary (Last 24 hours) at 03/17/2017 1453 Last data filed at 03/17/2017 1100 Gross per 24 hour  Intake 4155 ml  Output -  Net 4155 ml   Filed Weights   03/15/17 1454 03/16/17 0657  Weight: 86.2 kg (190 lb) 90 kg (198 lb 6.6 oz)     Physical Examination:   Physical Exam: Eyes: No icterus, extraocular muscles intact  Mouth: Oral mucosa is moist, no lesions on palate,  Neck: Supple, no deformities, masses, or tenderness Lungs: Normal respiratory effort, bilateral clear to auscultation, no crackles or wheezes.  Heart: Regular rate and rhythm, S1 and S2 normal, no murmurs, rubs auscultated Abdomen: BS normoactive,soft,nondistended,non-tender to palpation,no organomegaly. Right CVA tenderness. Extremities: No pretibial edema, no erythema, no cyanosis, no clubbing Neuro : Alert and oriented to time, place and person, No focal deficits  Skin: No rashes seen on exam     Data Reviewed: I have personally reviewed following labs and imaging studies  CBG: No results for input(s): GLUCAP in the last 168 hours.  CBC: Recent Labs  Lab 03/15/17 1801 03/16/17 0422 03/17/17 [id number]  WBC 21.0* 19.5* 12.6*  NEUTROABS 18.9*  --   --   HGB 11.1* 10.9* 10.4*  HCT 33.8* 33.1* 30.5*  MCV 96.0 97.4 95.9  PLT 294 291 283    Basic Metabolic Panel: Recent Labs  Lab 03/15/17 1646 03/16/17 0422 03/17/17 0852  NA 135 138 137  K 3.2* 3.4* 3.3*  CL 105 107 109  CO2 20* 23 22  GLUCOSE 96 123* 85  BUN 12 8 <5*  CREATININE 0.55 0.76 0.66  CALCIUM 8.3* 8.1* 7.8*    Recent Results (from the past 240 hour(s))  Urine culture     Status: Abnormal (Preliminary result)   Collection Time: 03/15/17  2:59 PM  Result Value Ref Range Status   Specimen Description URINE, CLEAN CATCH  Final   Special Requests NONE  Final    Culture (A)  Final    >=100,000 COLONIES/mL ESCHERICHIA COLI SUSCEPTIBILITIES TO FOLLOW Performed at [facility], [address] [place], [place] [address]    Report Status PENDING  Incomplete     Liver Function Tests: Recent Labs  Lab 03/15/17 1646  AST 13*  ALT 16  ALKPHOS 47  BILITOT 0.5  PROT 7.4  ALBUMIN 3.6   No results for input(s): LIPASE, AMYLASE in the last 168 hours. No results for input(s): AMMONIA in the last 168 hours.  Cardiac Enzymes: No results for input(s): CKTOTAL, CKMB, CKMBINDEX, TROPONINI in the last 168 hours. BNP (last 3 results) No results for input(s): BNP in the last 8760 hours.  ProBNP (last 3 results) No results for input(s): PROBNP in the last 8760 hours.    Studies: Ct Renal Stone Study  Result Date: 03/15/2017 CLINICAL DATA:  31 y/o  F; right flank pain and hematuria. EXAM: CT ABDOMEN AND PELVIS WITHOUT CONTRAST TECHNIQUE: Multidetector CT imaging of the abdomen and pelvis was performed following the standard protocol without IV contrast. COMPARISON:  07/20/2015 CT abdomen and pelvis. FINDINGS: Lower chest: No acute abnormality. Hepatobiliary: No focal liver abnormality is seen. No gallstones, gallbladder wall thickening, or biliary dilatation. Pancreas: Unremarkable. No pancreatic ductal dilatation or surrounding inflammatory changes. Spleen: Normal in size without focal abnormality. Adrenals/Urinary Tract: Normal adrenal glands. Normal left kidney and ureter. Extensive right perinephric stranding. No right hydronephrosis. Decompressed bladder. Stomach/Bowel: Stomach is within normal limits. Appendix appears normal. No evidence of bowel wall thickening, distention, or inflammatory changes. Vascular/Lymphatic: No significant vascular findings are present. No enlarged abdominal or pelvic lymph nodes. Reproductive: Uterus and bilateral adnexa are unremarkable. Other: No abdominal wall hernia or abnormality. No abdominopelvic ascites.  Musculoskeletal: No fracture is seen. L5-S1 moderate loss of intervertebral disc space height. IMPRESSION: Extensive right perinephric stranding. No hydronephrosis or urinary stone disease identified. Findings may represent infection of the renal collecting system/pyelonephritis or a recently passed stone. Electronically Signed   By: Mitzi HansenLance  Furusawa-Stratton M.D.   On: 03/15/2017 22:28    Scheduled Meds: . enoxaparin (LOVENOX) injection  40 mg Subcutaneous Q24H  . nicotine  21 mg Transdermal Daily  . zolpidem  5 mg Oral Once      Time spent: 25 min  Meredeth IdeGagan S Emberlin Verner   [facility] Pager [phone]. If 7PM-7AM, please contact night-coverage at [email or web address], Office  [phone]  password [email or web address]  03/17/2017, 2:53 PM  LOS: 1 day

## 2017-03-17 NOTE — Progress Notes (Signed)
2 drops Peppermint EO placed on 2x2 gauze in paper med cup at patient's bedside for aromatherapy for pain relief. Patient states pain easing slightly.

## 2017-03-18 LAB — CBC
HEMATOCRIT: 34.5 % — AB (ref 36.0–46.0)
HEMOGLOBIN: 11.3 g/dL — AB (ref 12.0–15.0)
MCH: 31.8 pg (ref 26.0–34.0)
MCHC: 32.8 g/dL (ref 30.0–36.0)
MCV: 97.2 fL (ref 78.0–100.0)
Platelets: 311 10*3/uL (ref 150–400)
RBC: 3.55 MIL/uL — ABNORMAL LOW (ref 3.87–5.11)
RDW: 13.6 % (ref 11.5–15.5)
WBC: 8.9 10*3/uL (ref 4.0–10.5)

## 2017-03-18 LAB — URINE CULTURE: Culture: >=100000 — AB

## 2017-03-18 LAB — BASIC METABOLIC PANEL
ANION GAP: 6 (ref 5–15)
BUN: 6 mg/dL (ref 6–20)
CALCIUM: 8.1 mg/dL — AB (ref 8.9–10.3)
CHLORIDE: 111 mmol/L (ref 101–111)
CO2: 23 mmol/L (ref 22–32)
CREATININE: 0.62 mg/dL (ref 0.44–1.00)
GFR calc non Af Amer: >60 mL/min (ref >60)
Glucose, Bld: 108 mg/dL — ABNORMAL HIGH (ref 65–99)
Potassium: 3.5 mmol/L (ref 3.5–5.1)
SODIUM: 140 mmol/L (ref 135–145)

## 2017-03-18 MED ORDER — PROMETHAZINE HCL 12.5 MG PO TABS: 12.5000 mg | ORAL_TABLET | Freq: Four times a day (QID) | ORAL | 0 refills | Status: AC | PRN

## 2017-03-18 MED ORDER — CIPROFLOXACIN HCL 500 MG PO TABS
500.0000 mg | ORAL_TABLET | Freq: Two times a day (BID) | ORAL | 0 refills | Status: AC
Start: 2017-03-18 — End: 2017-03-25

## 2017-03-18 MED ORDER — OXYCODONE-ACETAMINOPHEN 7.5-325 MG PO TABS: 1.0000 | ORAL_TABLET | Freq: Three times a day (TID) | ORAL | 0 refills | Status: AC | PRN

## 2017-03-18 NOTE — Discharge Summary (Addendum)
Physician Discharge Summary  Arnette NorrisDeosha Dobkins AVW:[id number] DOB: [birth year] DOA: 03/15/2017  PCP: Patient, No Pcp Per  Admit date: 03/15/2017 Discharge date: 03/18/2017  Time spent: 25* minutes  Recommendations for Outpatient Follow-up:  1. Follow up PCP in 2 weeks   Discharge Diagnoses:  Principal Problem:   Pyelonephritis Active Problems:   Sepsis secondary to UTI [facility](HCC)   Discharge Condition: Stable  Diet recommendation: Low sodium diet  Filed Weights   03/15/17 1454 03/16/17 0657  Weight: 86.2 kg (190 lb) 90 kg (198 lb 6.6 oz)    History of present illness:  Patient seen and examined, continues to have right flank pain. Urine culture growing equally. Final sensitivity is pending. Patient is afebrile    Hospital Course:   1. Right pyelonephritis- urine culture gew E coli, sensitive to ceftriaxone, ciprofloxacin. She is afebrile, WBC is down to 8.9 clinically she has significantly improved. Will discharge on ciprofloxacin 500 mg PO BID for seven more days. Percocet one tablet Q8 hours PRN for pain 2. Hypokalemia-replete,  today potassium is 3.5.    Procedures:   none  Consultations:  none  Discharge Exam: Vitals:   03/17/17 2045 03/18/17 0458  BP: 113/67 129/74  Pulse: 68 (!) 57  Resp: 18 20  Temp: 98.1 F (36.7 C) 98.2 F (36.8 C)  SpO2: 100% 100%    General: appears in no acute distress Cardiovascular: S1 S2, regular Respiratory: clear to auscultation bilaterally  Discharge Instructions   Discharge Instructions    Diet - low sodium heart healthy   Complete by:  As directed    Increase activity slowly   Complete by:  As directed      Allergies as of 03/18/2017      Reactions   Ketorolac Tromethamine    seizure      Medication List    STOP taking these medications   acetaminophen 500 MG tablet Commonly known as:  TYLENOL     TAKE these medications   ciprofloxacin 500 MG tablet Commonly known as:  CIPRO Take 1 tablet (500 mg  total) by mouth 2 (two) times daily for 7 days.   oxyCODONE-acetaminophen 7.5-325 MG tablet Commonly known as:  PERCOCET Take 1 tablet by mouth every 8 (eight) hours as needed for moderate pain.   PRESCRIPTION MEDICATION Take 1 tablet by mouth daily.   promethazine 12.5 MG tablet Commonly known as:  PHENERGAN Take 1 tablet (12.5 mg total) by mouth every 6 (six) hours as needed for nausea or vomiting.      Allergies  Allergen Reactions  . Ketorolac Tromethamine     seizure      The results of significant diagnostics from this hospitalization (including imaging, microbiology, ancillary and laboratory) are listed below for reference.    Significant Diagnostic Studies: Ct Renal Stone Study  Result Date: 03/15/2017 CLINICAL DATA:  31 y/o  F; right flank pain and hematuria. EXAM: CT ABDOMEN AND PELVIS WITHOUT CONTRAST TECHNIQUE: Multidetector CT imaging of the abdomen and pelvis was performed following the standard protocol without IV contrast. COMPARISON:  07/20/2015 CT abdomen and pelvis. FINDINGS: Lower chest: No acute abnormality. Hepatobiliary: No focal liver abnormality is seen. No gallstones, gallbladder wall thickening, or biliary dilatation. Pancreas: Unremarkable. No pancreatic ductal dilatation or surrounding inflammatory changes. Spleen: Normal in size without focal abnormality. Adrenals/Urinary Tract: Normal adrenal glands. Normal left kidney and ureter. Extensive right perinephric stranding. No right hydronephrosis. Decompressed bladder. Stomach/Bowel: Stomach is within normal limits. Appendix appears normal. No evidence of bowel wall  thickening, distention, or inflammatory changes. Vascular/Lymphatic: No significant vascular findings are present. No enlarged abdominal or pelvic lymph nodes. Reproductive: Uterus and bilateral adnexa are unremarkable. Other: No abdominal wall hernia or abnormality. No abdominopelvic ascites. Musculoskeletal: No fracture is seen. L5-S1 moderate  loss of intervertebral disc space height. IMPRESSION: Extensive right perinephric stranding. No hydronephrosis or urinary stone disease identified. Findings may represent infection of the renal collecting system/pyelonephritis or a recently passed stone. Electronically Signed   By: Mitzi HansenLance  Furusawa-Stratton M.D.   On: 03/15/2017 22:28    Microbiology: Recent Results (from the past 240 hour(s))  Urine culture     Status: Abnormal   Collection Time: 03/15/17  2:59 PM  Result Value Ref Range Status   Specimen Description URINE, CLEAN CATCH  Final   Special Requests NONE  Final   Culture >=100,000 COLONIES/mL ESCHERICHIA COLI (A)  Final   Report Status 03/18/2017 FINAL  Final   Organism ID, Bacteria ESCHERICHIA COLI (A)  Final      Susceptibility   Escherichia coli - MIC*    AMPICILLIN <=2 SENSITIVE Sensitive     CEFAZOLIN <=4 SENSITIVE Sensitive     CEFTRIAXONE <=1 SENSITIVE Sensitive     CIPROFLOXACIN <=0.25 SENSITIVE Sensitive     GENTAMICIN <=1 SENSITIVE Sensitive     IMIPENEM <=0.25 SENSITIVE Sensitive     NITROFURANTOIN <=16 SENSITIVE Sensitive     TRIMETH/SULFA <=20 SENSITIVE Sensitive     AMPICILLIN/SULBACTAM <=2 SENSITIVE Sensitive     PIP/TAZO <=4 SENSITIVE Sensitive     Extended ESBL NEGATIVE Sensitive     * >=100,000 COLONIES/mL ESCHERICHIA COLI     Labs: Basic Metabolic Panel: Recent Labs  Lab 03/15/17 1646 03/16/17 0422 03/17/17 0852 03/18/17 0458  NA 135 138 137 140  K 3.2* 3.4* 3.3* 3.5  CL 105 107 109 111  CO2 20* 23 22 23   GLUCOSE 96 123* 85 108*  BUN 12 8 <5* 6  CREATININE 0.55 0.76 0.66 0.62  CALCIUM 8.3* 8.1* 7.8* 8.1*   Liver Function Tests: Recent Labs  Lab 03/15/17 1646  AST 13*  ALT 16  ALKPHOS 47  BILITOT 0.5  PROT 7.4  ALBUMIN 3.6   No results for input(s): LIPASE, AMYLASE in the last 168 hours. No results for input(s): AMMONIA in the last 168 hours. CBC: Recent Labs  Lab 03/15/17 1801 03/16/17 0422 03/17/17 0852 03/18/17 0458   WBC 21.0* 19.5* 12.6* 8.9  NEUTROABS 18.9*  --   --   --   HGB 11.1* 10.9* 10.4* 11.3*  HCT 33.8* 33.1* 30.5* 34.5*  MCV 96.0 97.4 95.9 97.2  PLT 294 291 283 311       Signed:  Meredeth IdeGagan S Roshunda Keir MD.  [facility] 03/18/2017, 11:42 AM

## 2017-03-18 NOTE — Progress Notes (Signed)
Reports no BM since 27th.   May we have a PRN Laxative order?

## 2017-06-14 IMAGING — US US OB COMP LESS 14 WK
1 series · 14 of 28 positions shown · non-contrast
Comparison: None.

CT of the abdomen and pelvis on 07/20/2015

CLINICAL DATA: Patient complains of right flank pain for 1 day.
Positive pregnancy test in the ER. By LMP patient is 04/16/2016.
Patient is 8 weeks 0 days by LMP. EDC by LMP is 01/21/2017.

EXAM:
TWIN OBSTETRIC <14WK US AND TRANSVAGINAL OB US

[Series 1: us ob comp less 14 wk · 0.22mm/px · 66 acquisitions, 14 frames shown]
[im 3/66]
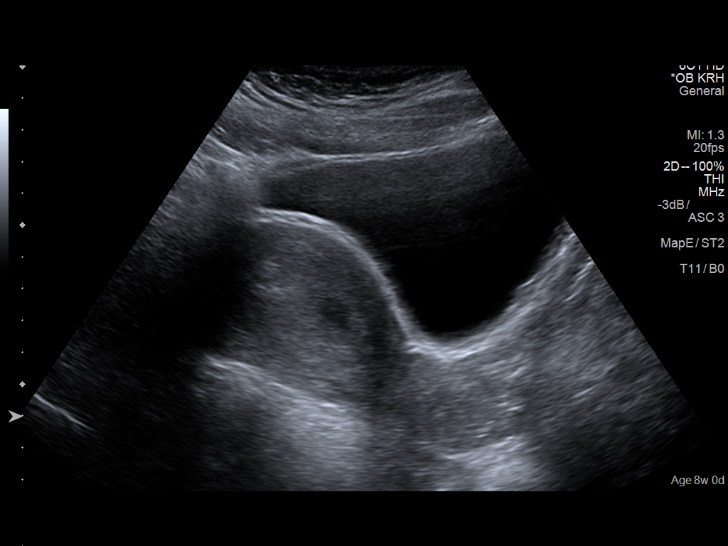
[im 8/66]
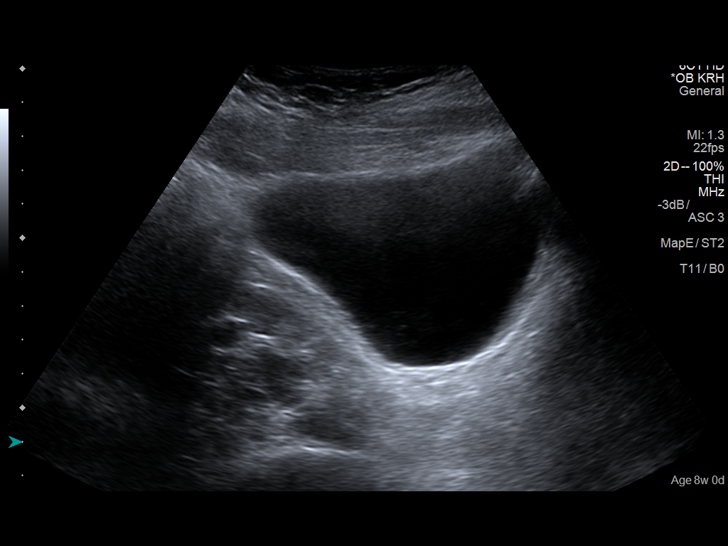
[im 13/66]
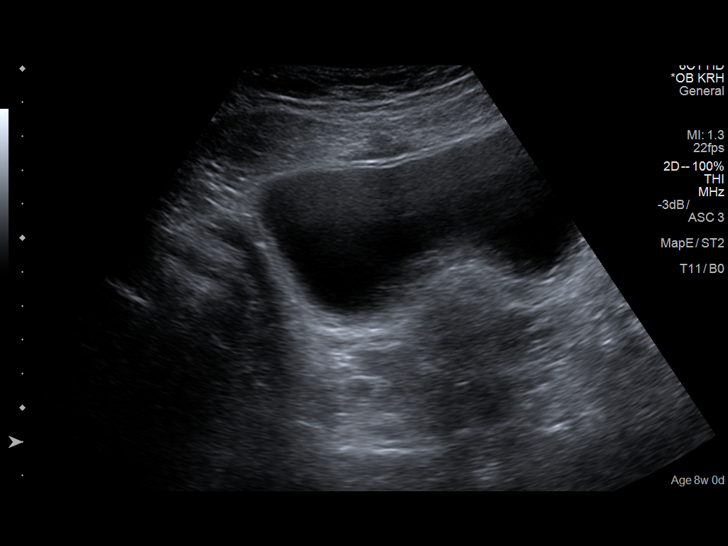
[im 17/66]
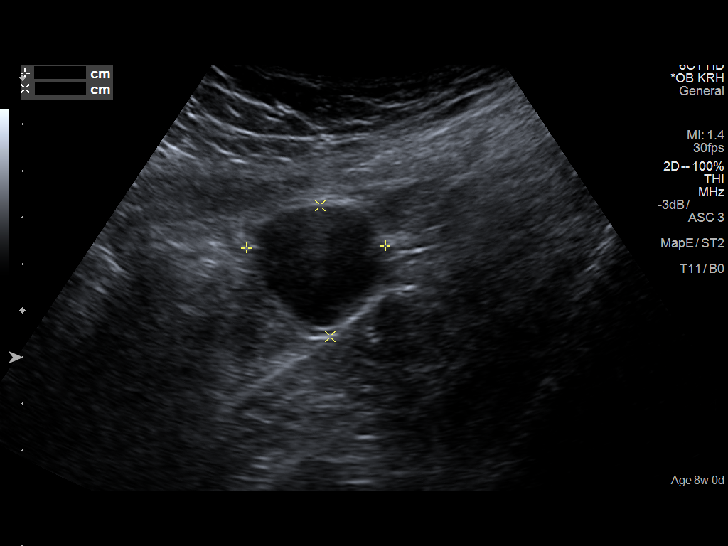
[im 22/66]
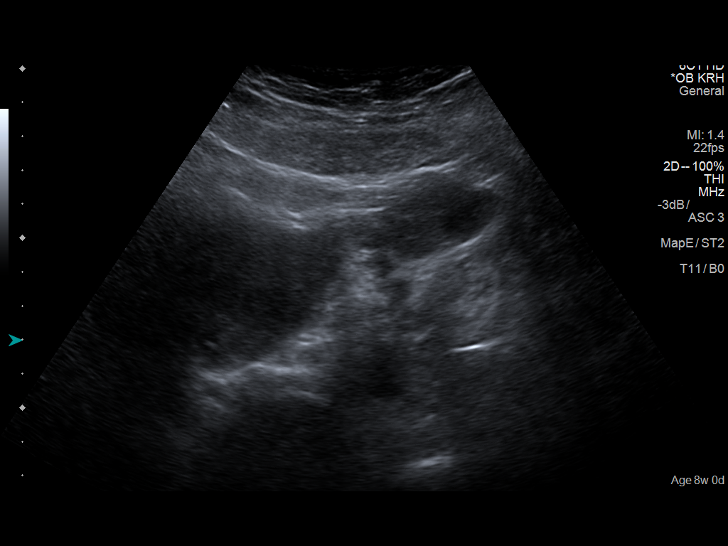
[im 27/66]
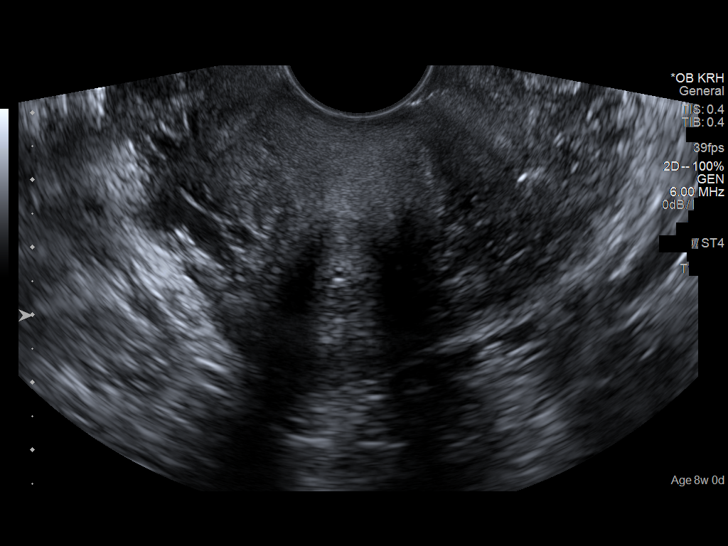
[im 32/66]
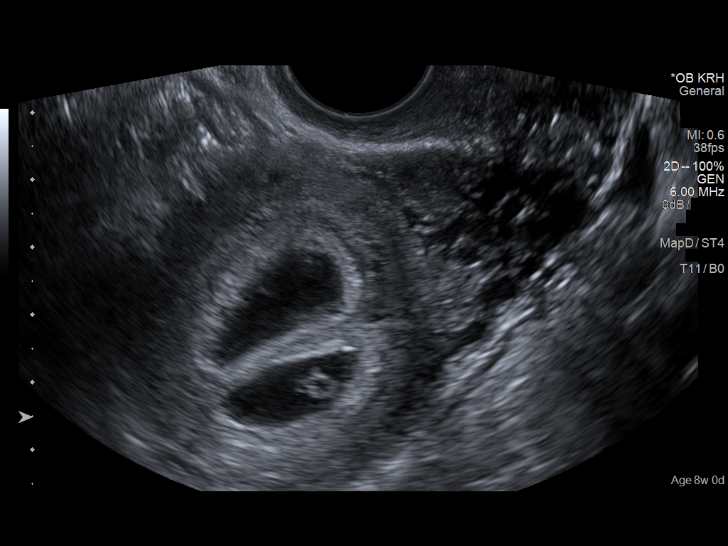
[im 37/66]
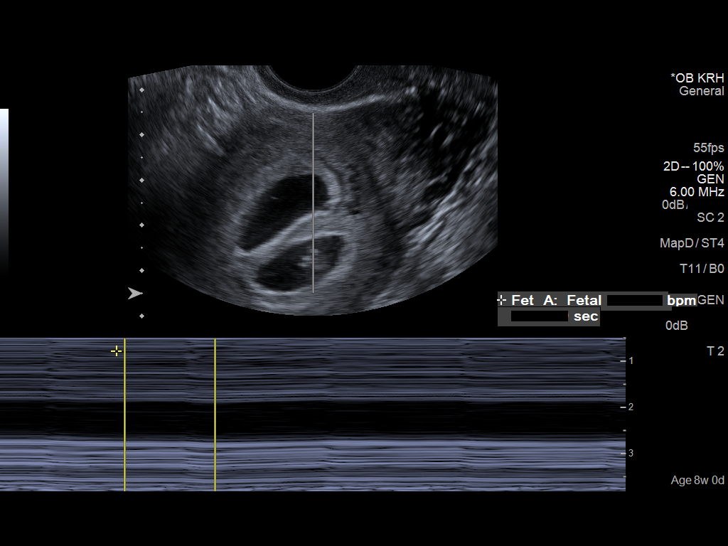
[im 41/66]
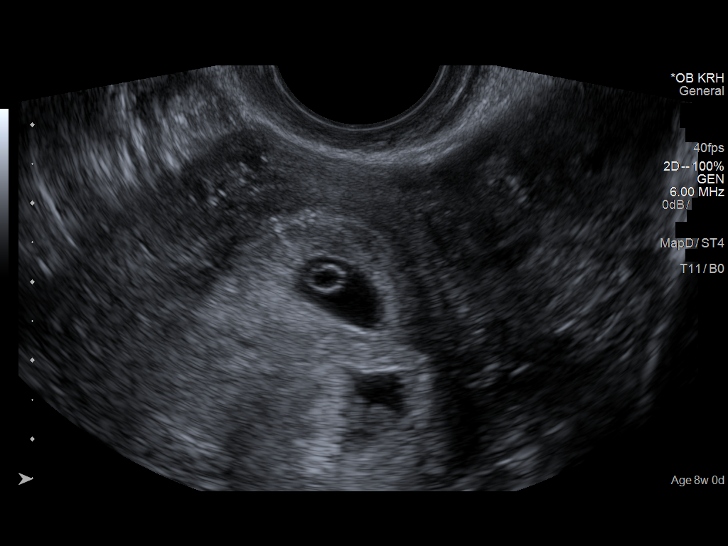
[im 46/66]
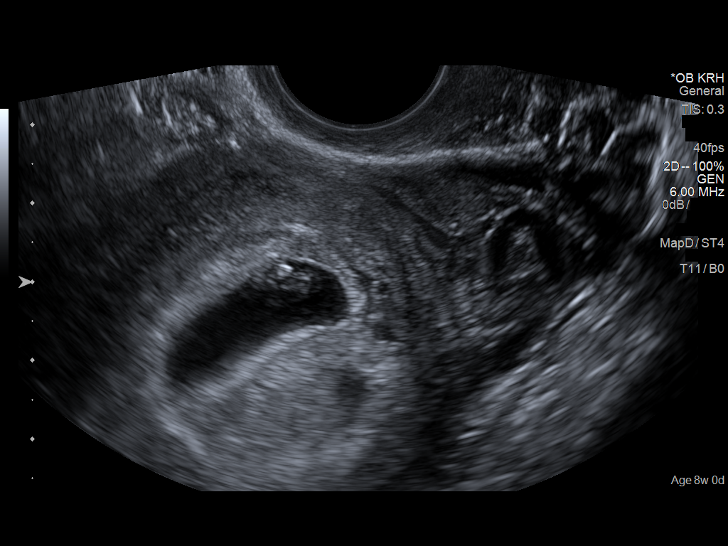
[im 51/66]
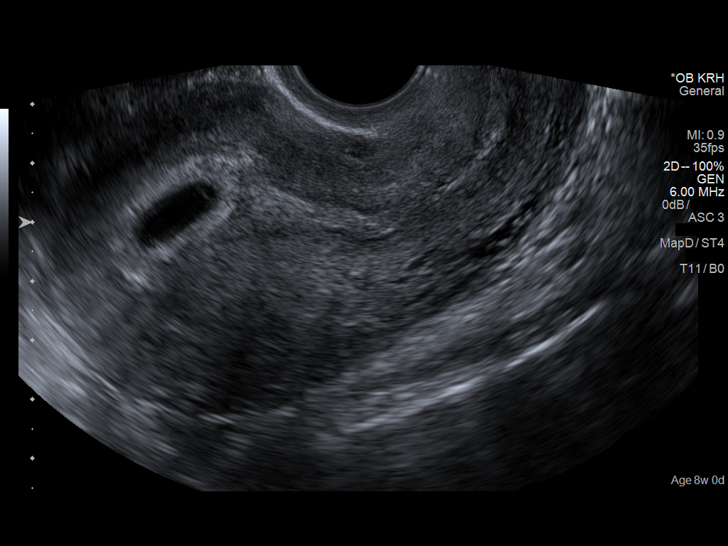
[im 56/66]
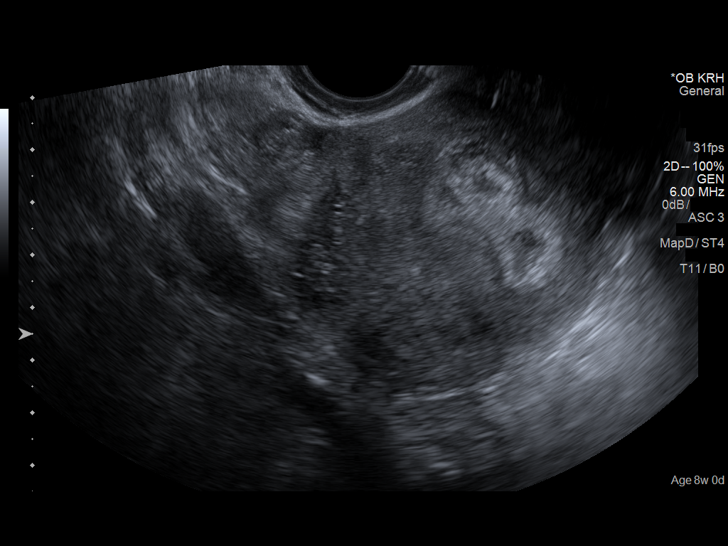
[im 61/66]
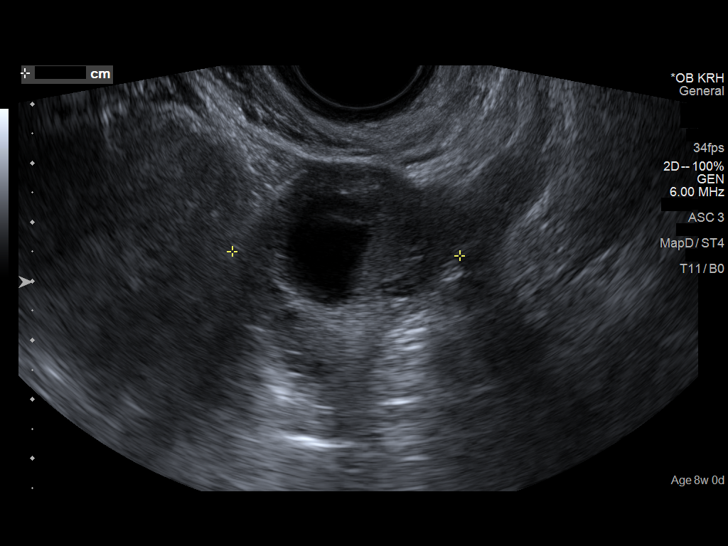
[im 66/66]
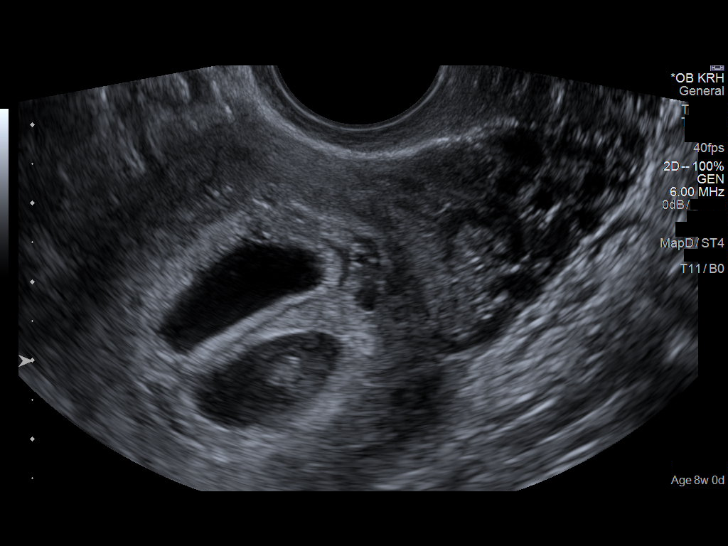

[14 of 28 positions shown; findings below may reference images not displayed]

FINDINGS: Number of IUPs:  2

Chorionicity/Amnionicity:  Diamniotic/ dichorionic

TWIN 1

Yolk sac:  Present

Embryo:  Present

Cardiac Activity: Present

Heart Rate: 113 bpm

CRL:  5.5  mm   6 w 2 d                  US EDC: 02/02/2017

TWIN 2

Yolk sac:  Present

Embryo:  Present

Cardiac Activity: Present

Heart Rate: 120 bpm

CRL:  7.3  mm   6 w 4 d                  US EDC: 01/31/2017

Subchorionic hemorrhage:  None visualized.

Maternal uterus/adnexae: Right ovary is not seen. Left corpus luteum
cyst is present. A small amount of %%%%%% a a trace amount of free
pelvic fluid is noted.
IMPRESSION: 1. Twin diamniotic/dichorionic gestation.
2. Ultrasound EDC of 02/01/2017, differing from clinical dating.
3. No subchorionic hemorrhage or adnexal mass.
4. Trace free pelvic fluid is likely physiologic.

## 2017-06-14 IMAGING — US US RENAL
1 series · 14 of 25 positions shown · non-contrast
Comparison: CT scan of July 20, 2015.

CLINICAL DATA: Right flank pain.

EXAM:
RENAL / URINARY TRACT ULTRASOUND COMPLETE

[Series 1: us renal · 0.20mm/px · 14 of 64 slices shown]
[im 1/64]
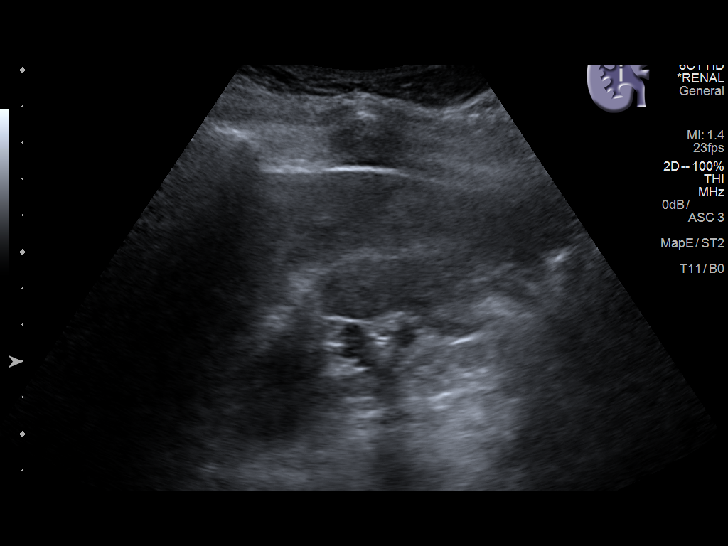
[im 6/64]
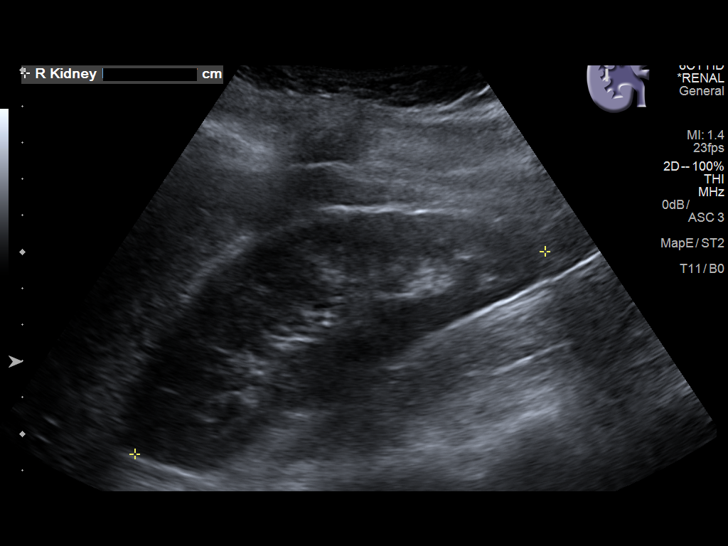
[im 11/64]
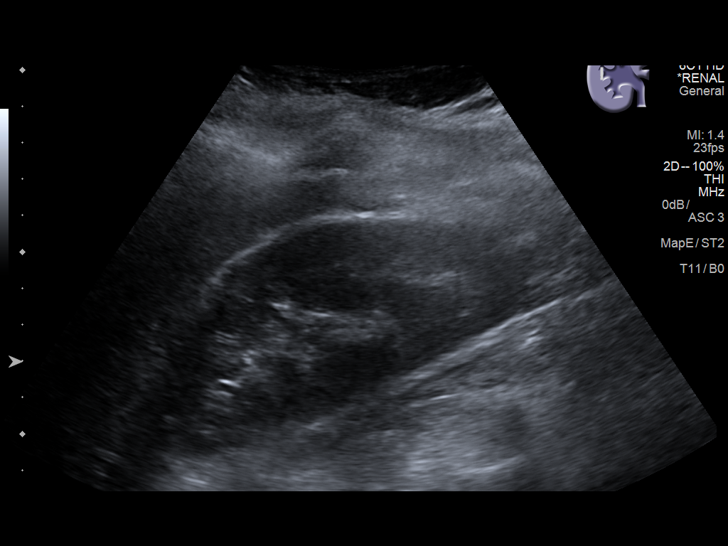
[im 16/64]
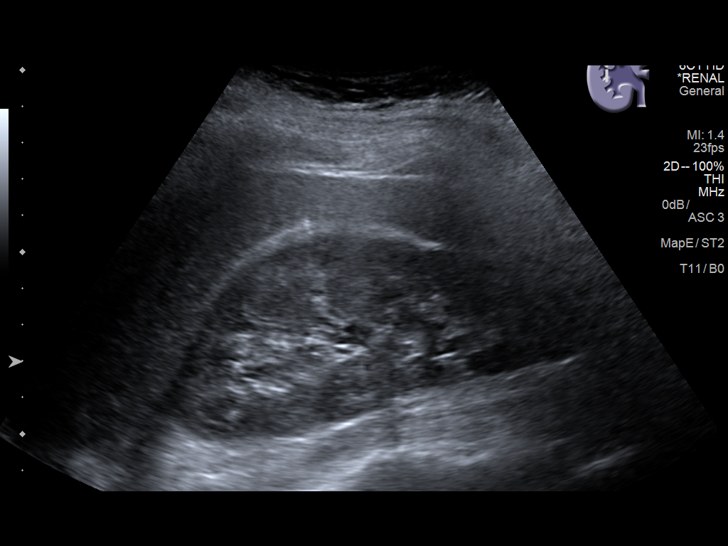
[im 22/64]
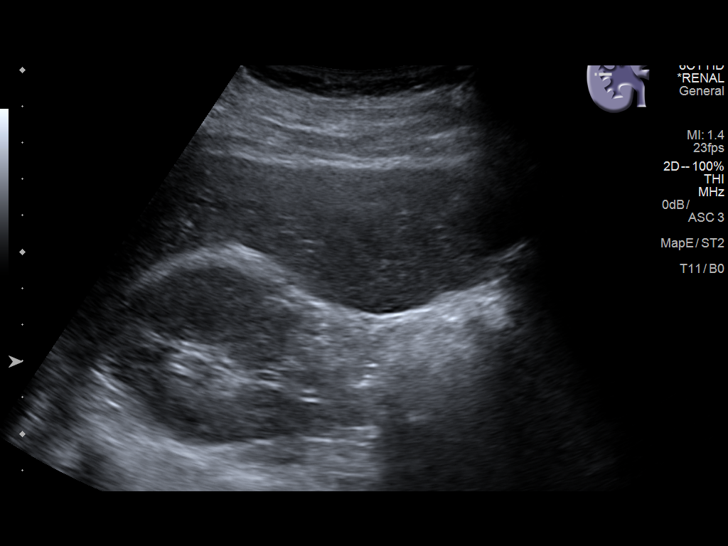
[im 24/64]
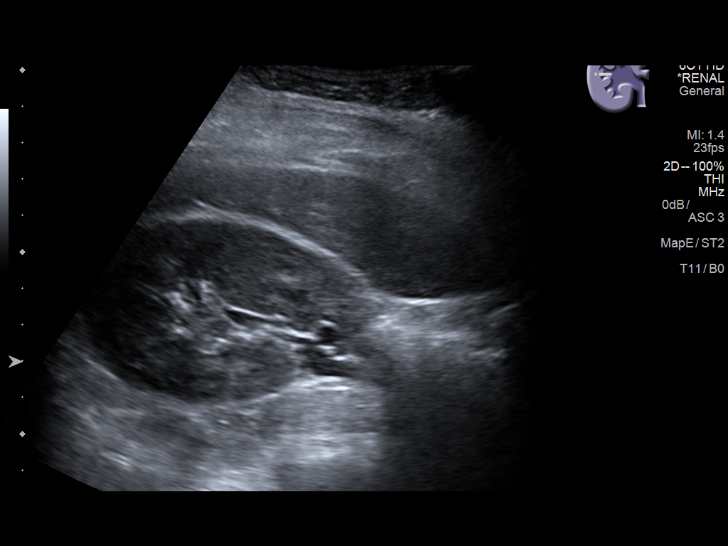
[im 29/64]
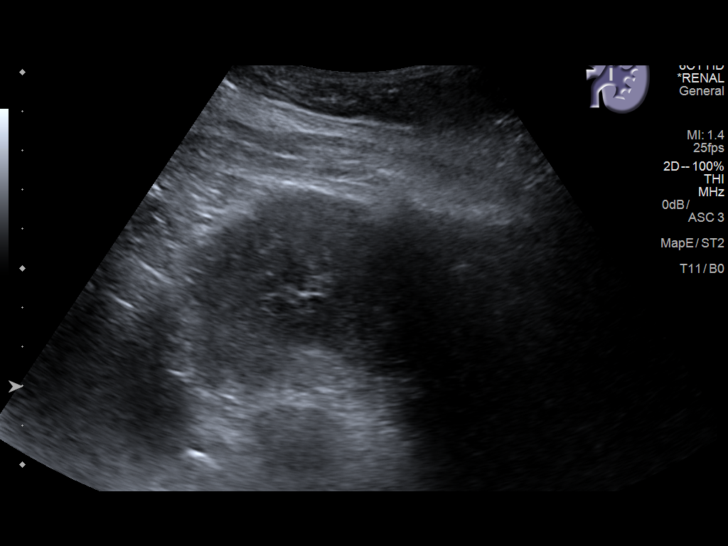
[im 35/64]
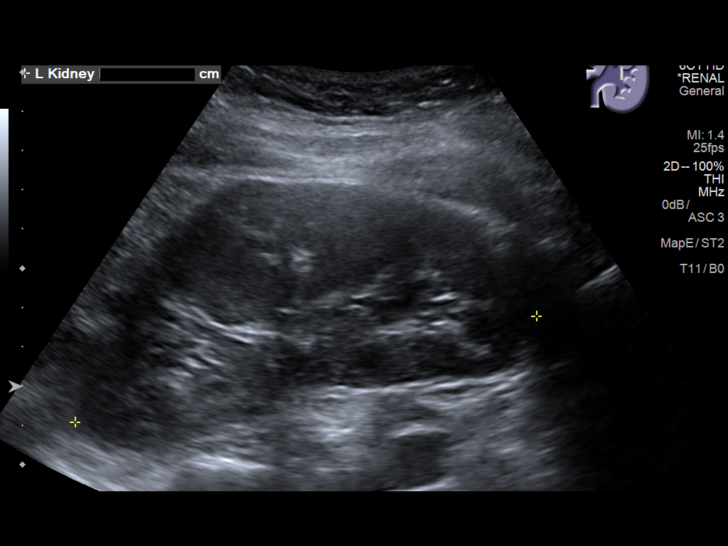
[im 40/64]
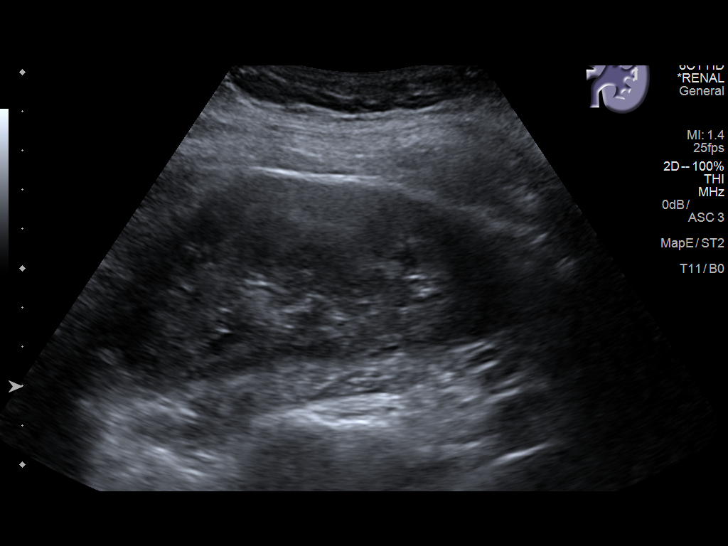
[im 43/64]
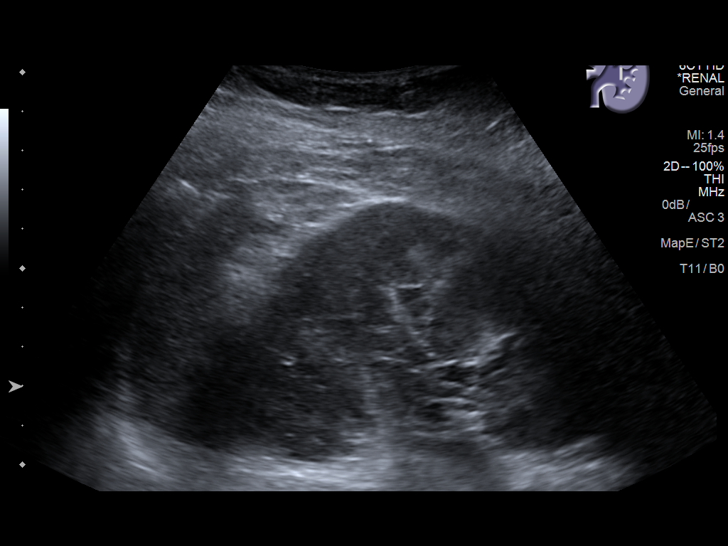
[im 48/64]
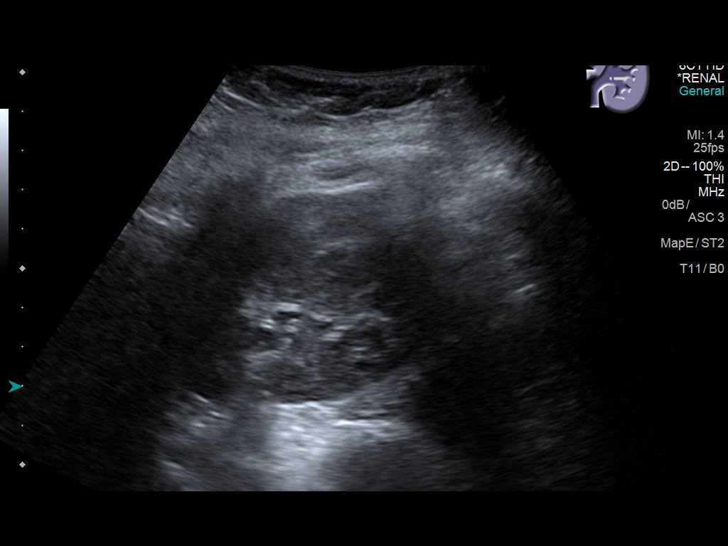
[im 53/64]
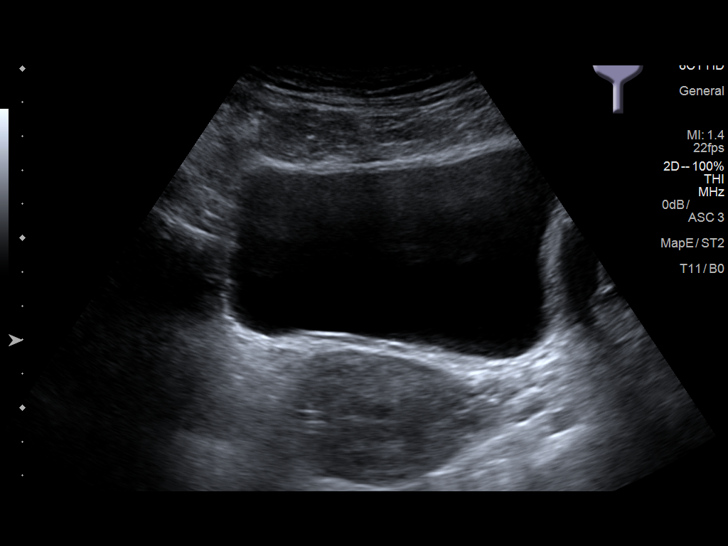
[im 58/64]
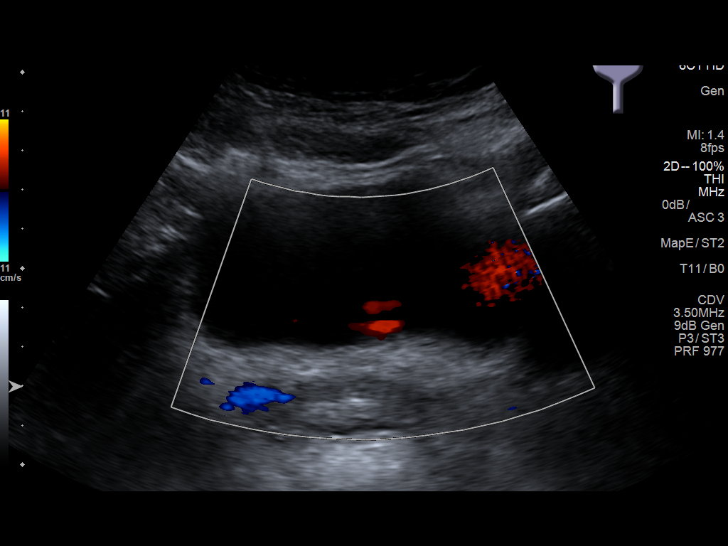
[im 64/64]
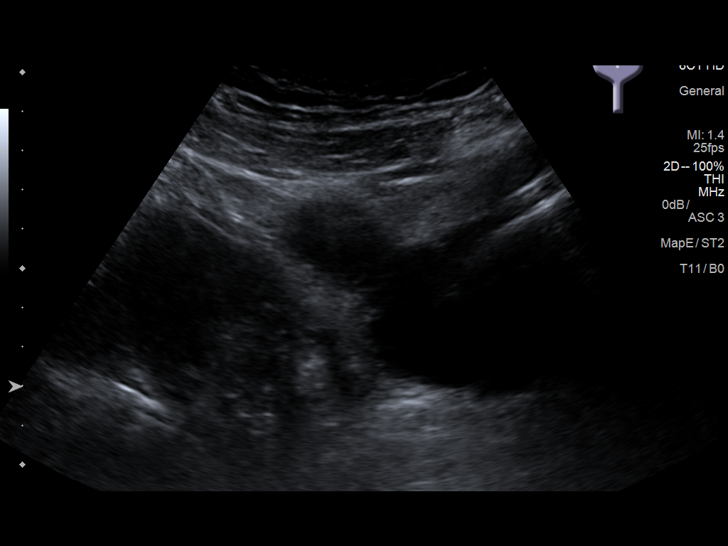

[14 of 25 positions shown; findings below may reference images not displayed]

FINDINGS: Right Kidney:

Length: 12 cm. Echogenicity within normal limits. No mass or
hydronephrosis visualized.

Left Kidney:

Length: 12.5 cm. Echogenicity within normal limits. No mass or
hydronephrosis visualized.

Bladder:

Appears normal for degree of bladder distention. Bilateral ureteral
jets are noted.
IMPRESSION: Normal renal ultrasound.

## 2019-04-24 IMAGING — CT CT RENAL STONE PROTOCOL
2 of 4 series · 16 of 46 positions shown, 18 images · non-contrast
Comparison: 07/20/2015 CT abdomen and pelvis.

CLINICAL DATA: 30 y/o  F; right flank pain and hematuria.

EXAM:
CT ABDOMEN AND PELVIS WITHOUT CONTRAST
TECHNIQUE: Multidetector CT imaging of the abdomen and pelvis was performed
following the standard protocol without IV contrast.

[Series 2: axial st · axial · 0.77mm/px · z∈[-576,-111]mm · 13 of 103 slices shown, 15 images]
[im 5/103  soft-tissue]
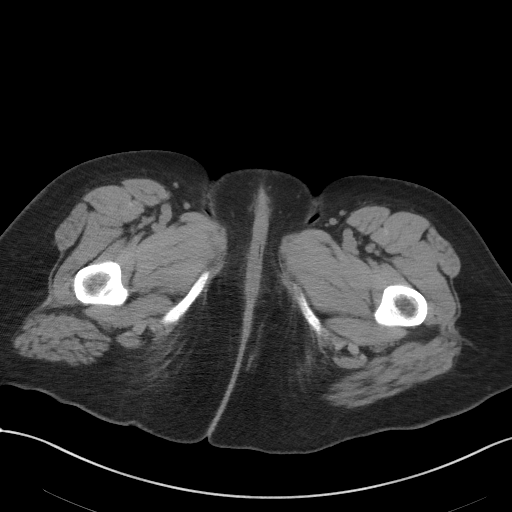
[im 5/103  bone]
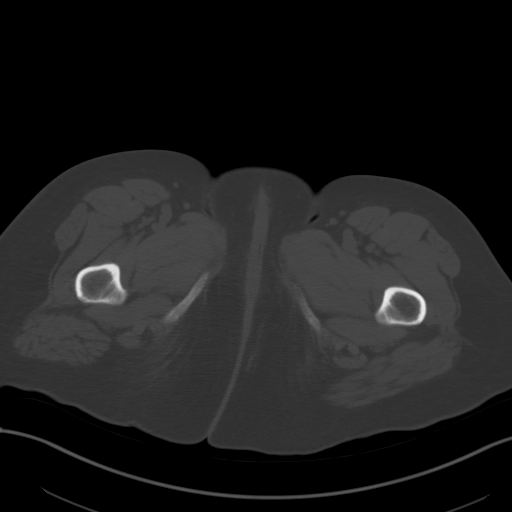
[im 14/103  soft-tissue]
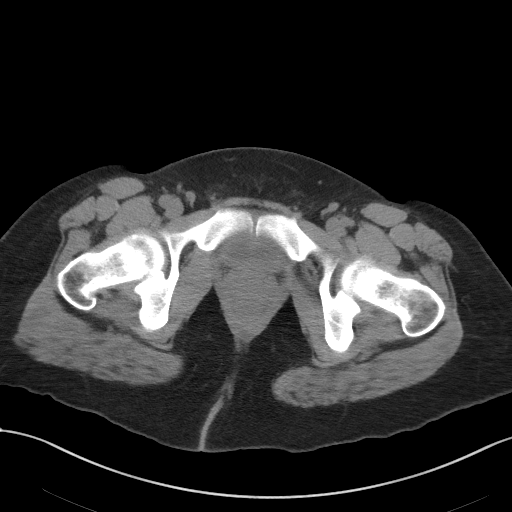
[im 23/103  soft-tissue]
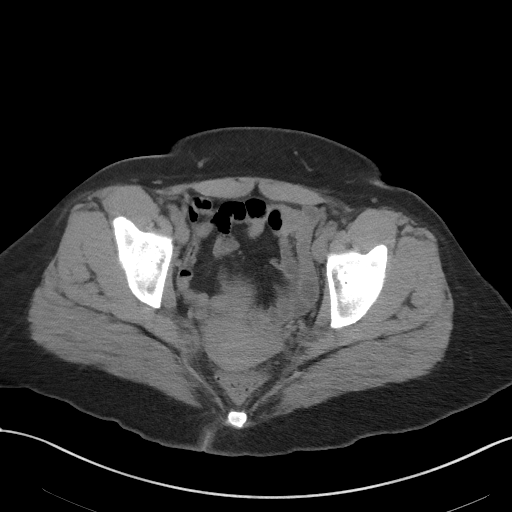
[im 27/103  soft-tissue]
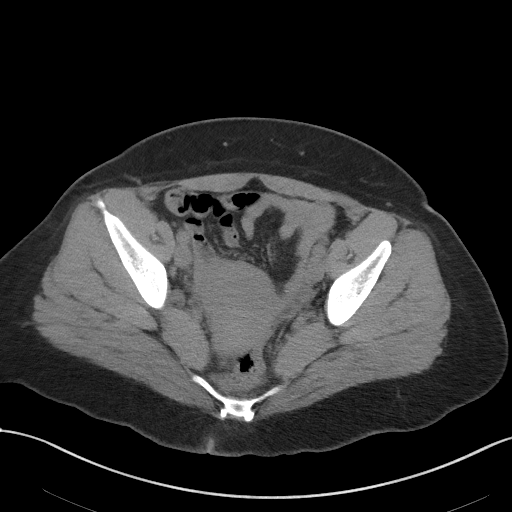
[im 36/103  soft-tissue]
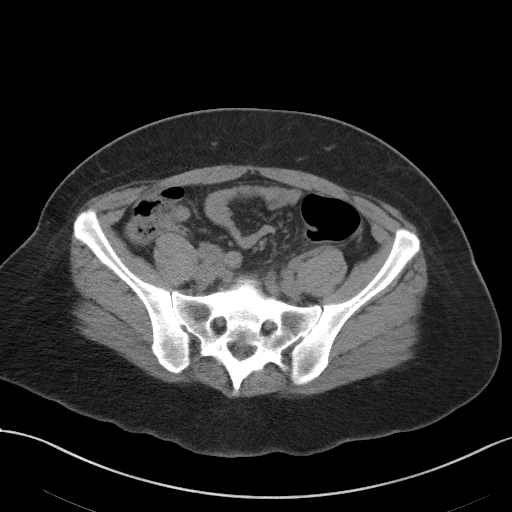
[im 45/103  soft-tissue]
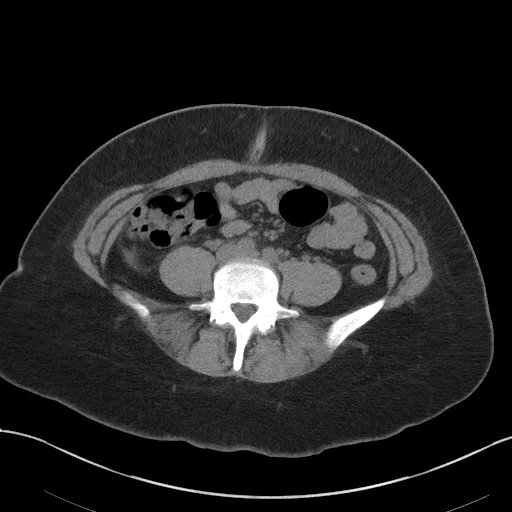
[im 54/103  soft-tissue]
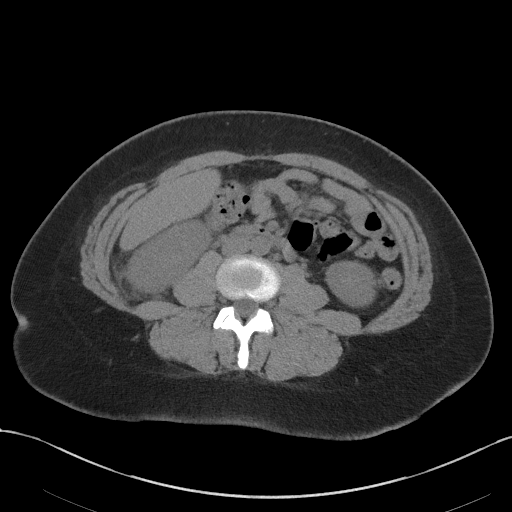
[im 58/103  soft-tissue]
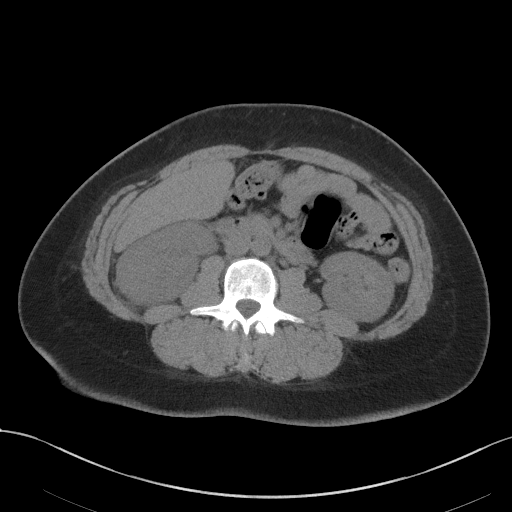
[im 67/103  soft-tissue]
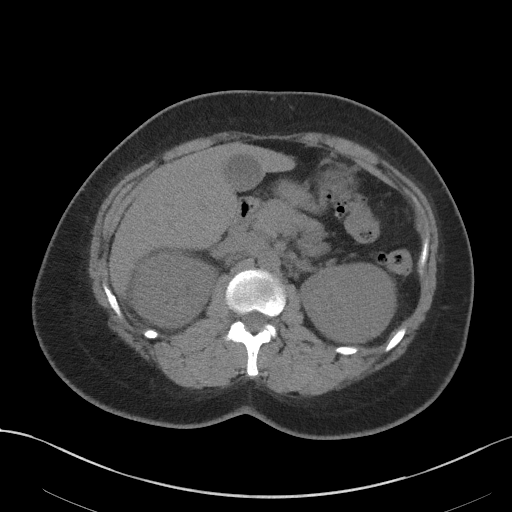
[im 67/103  bone]
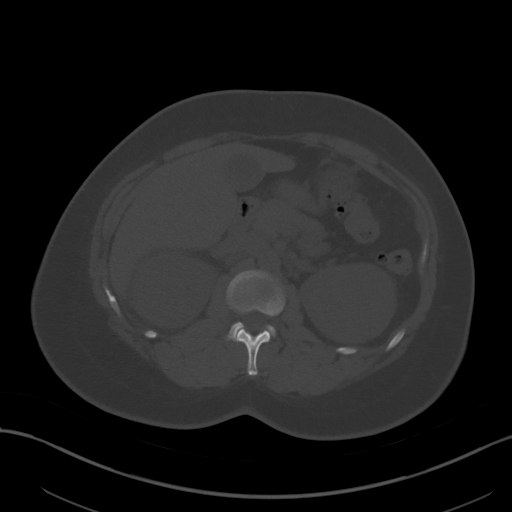
[im 76/103  soft-tissue]
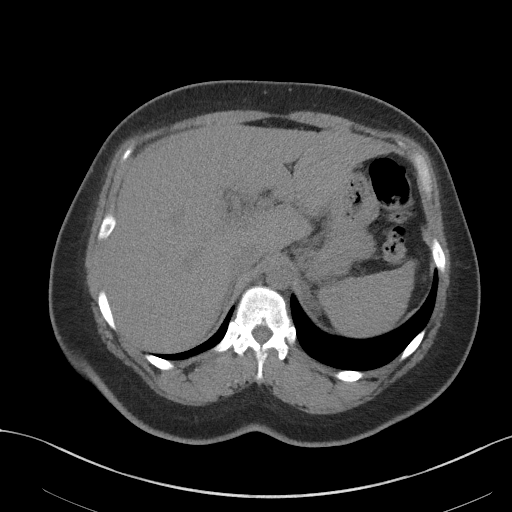
[im 80/103  soft-tissue]
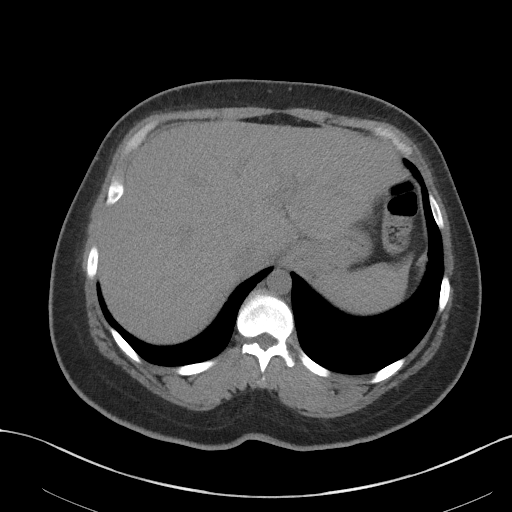
[im 89/103  soft-tissue]
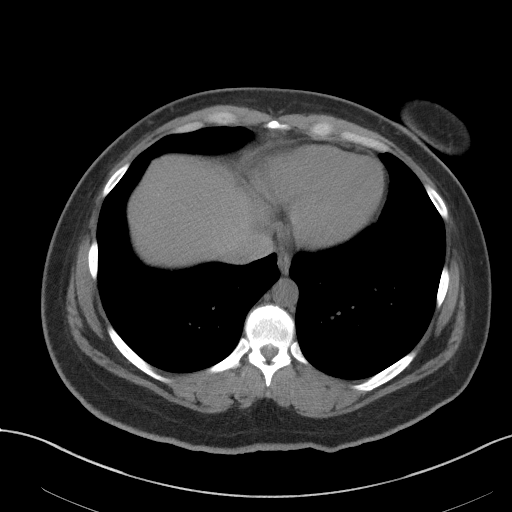
[im 98/103  soft-tissue]
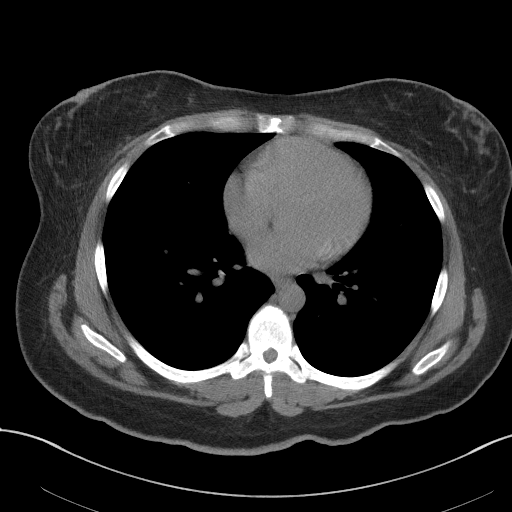

[Series 4: coronal st · coronal · 0.84mm/px · 3 of 72 slices shown]
[im 24/72  soft-tissue]
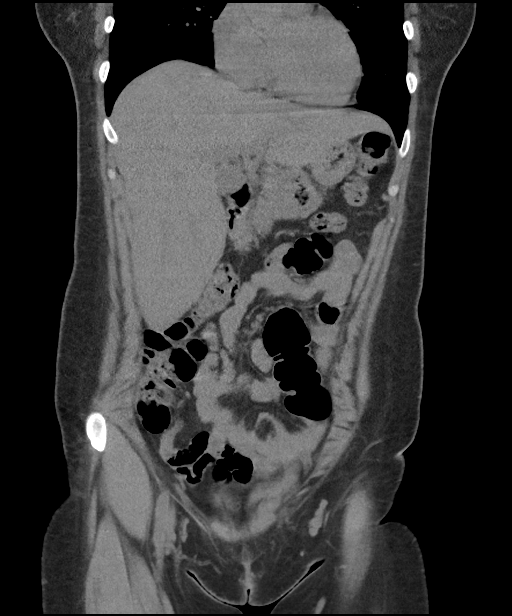
[im 32/72  soft-tissue]
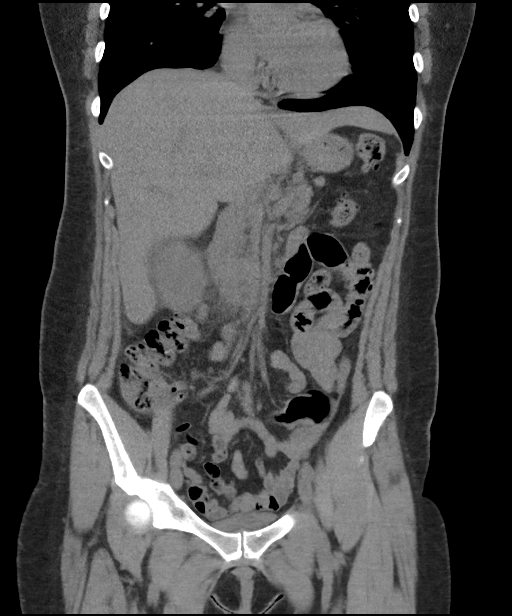
[im 40/72  soft-tissue]
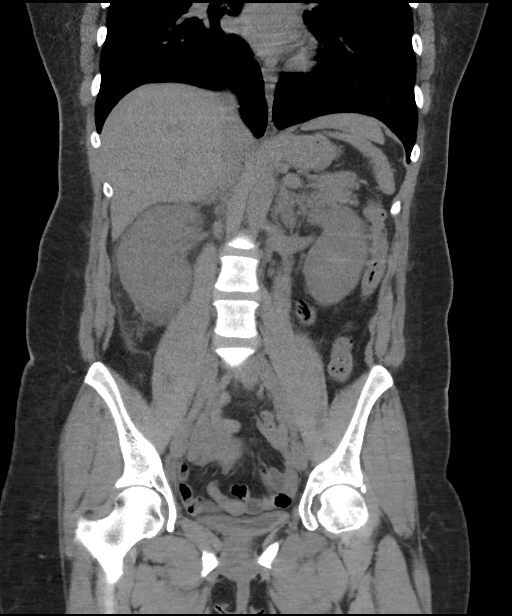

[16 of 46 positions shown; findings below may reference images not displayed]

FINDINGS: Lower chest: No acute abnormality.

Hepatobiliary: No focal liver abnormality is seen. No gallstones,
gallbladder wall thickening, or biliary dilatation.

Pancreas: Unremarkable. No pancreatic ductal dilatation or
surrounding inflammatory changes.

Spleen: Normal in size without focal abnormality.

Adrenals/Urinary Tract: Normal adrenal glands. Normal left kidney
and ureter. Extensive right perinephric stranding. No right
hydronephrosis. Decompressed bladder.

Stomach/Bowel: Stomach is within normal limits. Appendix appears
normal. No evidence of bowel wall thickening, distention, or
inflammatory changes.

Vascular/Lymphatic: No significant vascular findings are present. No
enlarged abdominal or pelvic lymph nodes.

Reproductive: Uterus and bilateral adnexa are unremarkable.

Other: No abdominal wall hernia or abnormality. No abdominopelvic
ascites.

Musculoskeletal: No fracture is seen. L5-S1 moderate loss of
intervertebral disc space height.
IMPRESSION: Extensive right perinephric stranding. No hydronephrosis or urinary
stone disease identified. Findings may represent infection of the
renal collecting system/pyelonephritis or a recently passed stone.

By: Omaida Riano M.D.

## 2019-11-21 ENCOUNTER — Telehealth: Payer: Self-pay | Admitting: Physician Assistant

## 2019-11-21 DIAGNOSIS — L0291 Cutaneous abscess, unspecified: Secondary | ICD-10-CM

## 2019-11-21 MED ORDER — SULFAMETHOXAZOLE-TRIMETHOPRIM 800-160 MG PO TABS: 1.0000 | ORAL_TABLET | Freq: Two times a day (BID) | ORAL | 0 refills | Status: AC

## 2019-11-21 MED ORDER — SULFAMETHOXAZOLE-TRIMETHOPRIM 800-160 MG PO TABS: 1.0000 | ORAL_TABLET | Freq: Two times a day (BID) | ORAL | 0 refills | Status: DC

## 2019-11-21 NOTE — Progress Notes (Signed)
E Visit for Cellulitis  We are sorry that you are not feeling well. Here is how we plan to help!  Based on what you shared with me it looks like you have an abscess and maybe some celluitis.  Cellulitis looks like areas of skin redness, swelling, and warmth; it develops as a result of bacteria entering under the skin. Little red spots and/or bleeding can be seen in skin, and tiny surface sacs containing fluid can occur. Fever can be present. Cellulitis is almost always on one side of a body, and the lower limbs are the most common site of involvement. If this antibiotic does not improve your symptoms, you will need a face to face visit for possible drainage.  I have prescribed: Bactrim DS 1 tablet by mouth twice a day for 7 days  HOME CARE:  . Take your medications as ordered and take all of them, even if the skin irritation appears to be healing.   GET HELP RIGHT AWAY IF:  . Symptoms that don't begin to go away within 48 hours. . Severe redness persists or worsens . If the area turns color, spreads or swells. . If it blisters and opens, develops yellow-brown crust or bleeds. . You develop a fever or chills. . If the pain increases or becomes unbearable.  . Are unable to keep fluids and food down.  MAKE SURE YOU    Understand these instructions.  Will watch your condition.  Will get help right away if you are not doing well or get worse.  Thank you for choosing an e-visit. Your e-visit answers were reviewed by a board certified advanced clinical practitioner to complete your personal care plan. Depending upon the condition, your plan could have included both over the counter or prescription medications. Please review your pharmacy choice. Make sure the pharmacy is open so you can pick up prescription now. If there is a problem, you may contact your provider through [employer] and have the prescription routed to another pharmacy. Your safety is important to [place]. If you have  drug allergies check your prescription carefully.  For the next 24 hours you can use MyChart to ask questions about today's visit, request a non-urgent call back, or ask for a work or school excuse. You will get an email in the next two days asking about your experience. I hope that your e-visit has been valuable and will speed your recovery.   Greater than 5 minutes, yet less than 10 minutes of time have been spent researching, coordinating, and implementing care for this patient today

## 2019-11-21 NOTE — Addendum Note (Signed)
Addended by: Dierdre Forth on: 11/21/2019 04:24 PM   Modules accepted: Orders

## 2020-02-18 DEATH — deceased
